# Patient Record
Sex: Male | Born: 1950
Health system: Southern US, Community
[De-identification: ages and names within clinical notes are randomized; demographics above are authoritative.]

## PROBLEM LIST (undated history)

## (undated) DIAGNOSIS — E785 Hyperlipidemia, unspecified: Secondary | ICD-10-CM

## (undated) HISTORY — PX: KNEE SURGERY: SHX244

---

## 2005-02-16 ENCOUNTER — Ambulatory Visit (HOSPITAL_COMMUNITY): Admission: RE | Admit: 2005-02-16 | Discharge: 2005-02-16 | Payer: Self-pay | Admitting: Internal Medicine

## 2006-07-06 ENCOUNTER — Ambulatory Visit (HOSPITAL_COMMUNITY): Admission: RE | Admit: 2006-07-06 | Discharge: 2006-07-06 | Payer: Self-pay | Admitting: Internal Medicine

## 2006-07-10 ENCOUNTER — Ambulatory Visit (HOSPITAL_COMMUNITY): Admission: RE | Admit: 2006-07-10 | Discharge: 2006-07-10 | Payer: Self-pay | Admitting: Internal Medicine

## 2006-07-17 ENCOUNTER — Ambulatory Visit (HOSPITAL_BASED_OUTPATIENT_CLINIC_OR_DEPARTMENT_OTHER): Admission: RE | Admit: 2006-07-17 | Discharge: 2006-07-17 | Payer: Self-pay | Admitting: Orthopaedic Surgery

## 2006-09-26 ENCOUNTER — Encounter (HOSPITAL_COMMUNITY): Admission: RE | Admit: 2006-09-26 | Discharge: 2006-10-26 | Payer: Self-pay | Admitting: Orthopaedic Surgery

## 2006-10-31 ENCOUNTER — Ambulatory Visit: Admission: RE | Admit: 2006-10-31 | Discharge: 2006-10-31 | Payer: Self-pay | Admitting: Orthopaedic Surgery

## 2006-10-31 ENCOUNTER — Ambulatory Visit: Payer: Self-pay | Admitting: Vascular Surgery

## 2009-01-27 ENCOUNTER — Ambulatory Visit (HOSPITAL_COMMUNITY): Admission: RE | Admit: 2009-01-27 | Discharge: 2009-01-27 | Payer: Self-pay | Admitting: Internal Medicine

## 2009-07-22 ENCOUNTER — Emergency Department (HOSPITAL_COMMUNITY): Admission: EM | Admit: 2009-07-22 | Discharge: 2009-07-22 | Payer: Self-pay | Admitting: Emergency Medicine

## 2010-09-13 NOTE — H&P (Signed)
NAME:  Brandon Neal, Brandon Neal               ACCOUNT NO.:  [id number]   MEDICAL RECORD NO.:  [id number]          PATIENT TYPE:  AMB   LOCATION:  DAY                           FACILITY:  [facility]   PHYSICIAN:  Dalia Heading, M.D.  DATE OF BIRTH:  09-Nov-1950   DATE OF ADMISSION:  DATE OF DISCHARGE:  LH                              HISTORY & PHYSICAL   CHIEF COMPLAINT:  Family history of colon carcinoma.   HISTORY OF PRESENT ILLNESS:  The patient is a 60 year old white male,  who is referred for endoscopic evaluation.  He needs colonoscopy due to  a family history of colon carcinoma in his father.  No abdominal pain,  weight loss, nausea, vomiting, diarrhea, constipation, melena,  hematochezia have been noted.  He last had  colonoscopy in 2003 which  was negative.   PAST MEDICAL HISTORY:  Hypertension.   PAST SURGICAL HISTORY:  Unremarkable.   CURRENT MEDICATIONS:  Diovan.   ALLERGIES:  No known drug allergies.   REVIEW OF SYSTEMS:  Noncontributory.   PHYSICAL EXAMINATION:  GENERAL:  The patient is a well-developed, well-  nourished white male in no acute distress.  LUNGS:  Clear to auscultation with equal breath sounds bilaterally.  HEART:  Regular rate and rhythm without S3, S4, or murmurs.  ABDOMEN:  Soft, nontender, nondistended.  No hepatosplenomegaly or  masses are noted.  RECTAL:  Deferred to the procedure.   IMPRESSION:  Family history of colon carcinoma.   PLAN:  The patient is scheduled for a colonoscopy on October 10, 2006.  The  risks and benefits of the procedure increasing bleeding and perforation  were fully explained to the patient, gave informed consent.      Dalia Heading, M.D.  Electronically Signed     MAJ/MEDQ  D:  10/02/2006  T:  10/02/2006  Job:  [id number]   cc:   [facility]  Fax: [phone]   Patrica Duel, M.D.  Fax: [phone]

## 2010-09-16 NOTE — Op Note (Signed)
NAME:  Brandon Neal, Brandon Neal               ACCOUNT NO.:  [id number]   MEDICAL RECORD NO.:  [id number]          PATIENT TYPE:  AMB   LOCATION:  DSC                          FACILITY:  [facility]   PHYSICIAN:  Claude Manges. Whitfield, M.D.DATE OF BIRTH:  Feb 14, 1951   DATE OF PROCEDURE:  07/17/2006  DATE OF DISCHARGE:                               OPERATIVE REPORT   PREOPERATIVE DIAGNOSIS:  Tear medial meniscus, right knee.   POSTOPERATIVE DIAGNOSIS:  Tear medial meniscus, right knee.   PROCEDURE:  1. Diagnostic arthroscopy right knee.  2. Arthroscopic medial meniscectomy.   SURGEON:  Claude Manges. Cleophas Dunker, M.D.   ANESTHESIA:  General laryngeal.   COMPLICATIONS:  None.   HISTORY:  60 year old gentleman has been experiencing some problems with  his right knee for approximately three weeks without history of injury  or trauma.  He has been followed by his family physician in [place]  who obtained an MRI scan revealing a complex tear of the posterior horn  of the medial meniscus extending into the posterior body of the  meniscus.  There was some mild partial tearing of the anterior and  posterior cruciate ligaments, grade III chondromalacia of the patella,  and moderate to large sized effusion.  There was also evidence of a mild  stress fracture of the medial tibial plateau with bone marrow edema  throughout the proximal tibia.  He is symptomatic medially and now we  will proceed with an arthroscopic evaluation.   DESCRIPTION OF PROCEDURE:  With the patient comfortable on the operating  table and under general laryngeal anesthesia, the right lower extremity  was placed in a thigh holder.  The leg was then prepped with DuraPrep  from the thigh holder to the ankle.  Sterile draping was performed.   Diagnostic arthroscopy was performed using a medial and lateral  parapatellar tendon stab wound and there was a small clear yellow joint  effusion.  Diagnostic arthroscopy revealed a tear of the  posterior third  of the medial meniscus which was complex in nature with both horizontal  cleavage tears and small flap tears. Using the basket forceps, the CUDA  shaver, and the ArthroCare wand, torn portions were removed and the  remaining rim was smoothed and stabilized with the ArthroCare wand.  Probing revealed no further tearing.  There was very minimal  chondromalacia, maybe grade 1-2 changes of chondromalacia of the medial  compartment. There was some mild synovitis which was debrided with the  articular wand in the medial compartment.  The ACL appeared to be  intact.  The lateral compartment was intact with no evidence of meniscal  tear or chondromalacia.   There was some grade II changes of chondromalacia of the patella and  this was debrided.  The joint was inspected without evidence of loose  material.  The two stab wounds were left open and infiltrated with 0.25%  Marcaine with epinephrine.  A sterile bulky dressing was applied  followed by an Ace bandage.   PLAN:  Percocet for pain.  Office in one week.      Claude Manges. Cleophas Dunker, M.D.  Electronically  Signed     PWW/MEDQ  D:  07/17/2006  T:  07/17/2006  Job:  [id number]

## 2011-08-22 ENCOUNTER — Encounter (INDEPENDENT_AMBULATORY_CARE_PROVIDER_SITE_OTHER): Payer: Self-pay | Admitting: *Deleted

## 2011-09-13 ENCOUNTER — Encounter (INDEPENDENT_AMBULATORY_CARE_PROVIDER_SITE_OTHER): Payer: Self-pay | Admitting: *Deleted

## 2011-09-13 ENCOUNTER — Telehealth (INDEPENDENT_AMBULATORY_CARE_PROVIDER_SITE_OTHER): Payer: Self-pay | Admitting: *Deleted

## 2011-09-13 ENCOUNTER — Other Ambulatory Visit (INDEPENDENT_AMBULATORY_CARE_PROVIDER_SITE_OTHER): Payer: Self-pay | Admitting: *Deleted

## 2011-09-13 DIAGNOSIS — Z1211 Encounter for screening for malignant neoplasm of colon: Secondary | ICD-10-CM

## 2011-09-13 NOTE — Telephone Encounter (Signed)
Patient needs movi prep 

## 2011-09-19 MED ORDER — PEG-KCL-NACL-NASULF-NA ASC-C 100 G PO SOLR: 1.0000 | Freq: Once | ORAL | Status: DC

## 2011-10-27 ENCOUNTER — Telehealth (INDEPENDENT_AMBULATORY_CARE_PROVIDER_SITE_OTHER): Payer: Self-pay | Admitting: *Deleted

## 2011-10-27 NOTE — Telephone Encounter (Signed)
PCP/Requesting MD: fagan  Name & DOB: Brandon Neal 07/12/1950     Procedure: tcs  Reason/Indication:  screening  Has patient had this procedure before?  no  If so, when, by whom and where?    Is there a family history of colon cancer?  no  Who?  What age when diagnosed?    Is patient diabetic?   no      Does patient have prosthetic heart valve?  no  Do you have a pacemaker?  no  Has patient had joint replacement within last 12 months?  no  Is patient on Coumadin, Plavix and/or Aspirin? yes  Medications: asa 81 mg daily, pravastatin 20 mg daily, one a day vit, calratin, otc acid med  Allergies: nkda  Medication Adjustment: asa 2 dasy  Procedure date & time: 11/23/11 at 830

## 2011-10-31 NOTE — Telephone Encounter (Signed)
agree

## 2011-11-14 ENCOUNTER — Encounter (HOSPITAL_COMMUNITY): Payer: Self-pay | Admitting: Pharmacy Technician

## 2011-11-22 MED ORDER — SODIUM CHLORIDE 0.45 % IV SOLN
Freq: Once | INTRAVENOUS | Status: AC
  Administered 2011-11-23: 08:00:00 via INTRAVENOUS

## 2011-11-23 ENCOUNTER — Ambulatory Visit (HOSPITAL_COMMUNITY)
Admission: RE | Admit: 2011-11-23 | Discharge: 2011-11-23 | Disposition: A | Discharge status: Home / Self Care | Payer: 59 | Source: Ambulatory Visit | Attending: Internal Medicine | Admitting: Internal Medicine

## 2011-11-23 ENCOUNTER — Encounter (HOSPITAL_COMMUNITY): Payer: Self-pay

## 2011-11-23 ENCOUNTER — Encounter (HOSPITAL_COMMUNITY)
Admission: RE | Disposition: A | Discharge status: Home / Self Care | Payer: Self-pay | Source: Ambulatory Visit | Attending: Internal Medicine

## 2011-11-23 DIAGNOSIS — K644 Residual hemorrhoidal skin tags: Secondary | ICD-10-CM | POA: Insufficient documentation

## 2011-11-23 DIAGNOSIS — Z1211 Encounter for screening for malignant neoplasm of colon: Secondary | ICD-10-CM | POA: Insufficient documentation

## 2011-11-23 DIAGNOSIS — K5730 Diverticulosis of large intestine without perforation or abscess without bleeding: Secondary | ICD-10-CM | POA: Insufficient documentation

## 2011-11-23 DIAGNOSIS — E785 Hyperlipidemia, unspecified: Secondary | ICD-10-CM | POA: Insufficient documentation

## 2011-11-23 HISTORY — PX: COLONOSCOPY: SHX5424

## 2011-11-23 HISTORY — DX: Hyperlipidemia, unspecified: E78.5

## 2011-11-23 SURGERY — COLONOSCOPY
Anesthesia: Moderate Sedation

## 2011-11-23 MED ORDER — MIDAZOLAM HCL 5 MG/5ML IJ SOLN
INTRAMUSCULAR | Status: DC | PRN
  Administered 2011-11-23 (×2): 2 mg via INTRAVENOUS

## 2011-11-23 MED ORDER — ATROPINE SULFATE 1 MG/ML IJ SOLN
INTRAMUSCULAR | Status: AC
  Filled 2011-11-23: qty 1

## 2011-11-23 MED ORDER — MIDAZOLAM HCL 5 MG/5ML IJ SOLN
INTRAMUSCULAR | Status: AC
  Filled 2011-11-23: qty 10

## 2011-11-23 MED ORDER — STERILE WATER FOR IRRIGATION IR SOLN
Status: DC | PRN
  Administered 2011-11-23: 09:00:00

## 2011-11-23 MED ORDER — ATROPINE SULFATE 1 MG/ML IJ SOLN
INTRAMUSCULAR | Status: DC | PRN
  Administered 2011-11-23 (×2): .5 mg via INTRAVENOUS

## 2011-11-23 MED ORDER — MEPERIDINE HCL 50 MG/ML IJ SOLN
INTRAMUSCULAR | Status: DC | PRN
  Administered 2011-11-23: 25 mg via INTRAVENOUS

## 2011-11-23 MED ORDER — MEPERIDINE HCL 50 MG/ML IJ SOLN
INTRAMUSCULAR | Status: AC
  Filled 2011-11-23: qty 1

## 2011-11-23 NOTE — OR Nursing (Signed)
844 Pt with HR in mid 30's.  Per verbal order Dr Karilyn Cota, administered 0.5 Atropine.  HR increased briefly into low 50's then dropped to high 30's. Per verbal order Dr Karilyn Cota, administered another 0.5 mg Atropine.  Pt repositioned on back.  HR increased to mid 60's.  Pt's skin warm, pink and dry

## 2011-11-23 NOTE — Op Note (Signed)
COLONOSCOPY PROCEDURE REPORT  PATIENT:  Brandon Neal  MR#:  [id number] Birthdate:  1951-04-12, 61 y.o., male Endoscopist:  Dr. Malissa Hippo, MD Referred By:  Dr. Carylon Perches, MD Procedure Date: 11/23/2011  Procedure:   Colonoscopy  Indications: Patient is 61 year old Caucasian male who is undergoing average risk screening colonoscopy to  Informed Consent:  The procedure and risks were reviewed with the patient and informed consent was obtained.  Medications:  Demerol 25 mg IV Versed 4 mg IV Atropine 1 mg IV for asymptomatic bradycardia.  Description of procedure:  After a digital rectal exam was performed, that colonoscope was advanced from the anus through the rectum and colon to the area of the cecum, ileocecal valve and appendiceal orifice. The cecum was deeply intubated. These structures were well-seen and photographed for the record. From the level of the cecum and ileocecal valve, the scope was slowly and cautiously withdrawn. The mucosal surfaces were carefully surveyed utilizing scope tip to flexion to facilitate fold flattening as needed. The scope was pulled down into the rectum where a thorough exam including retroflexion was performed.  Findings:   Prep satisfactory. Few small diverticula at sigmoid colon. No colonic polyps or other mucosal abnormalities. Normal rectal mucosa. Mall hemorrhoids below the dentate line.  Therapeutic/Diagnostic Maneuvers Performed:  None  Complications:  None  Cecal Withdrawal Time:  8 minutes  Impression:  Examination performed to cecum. Few small diverticula at sigmoid colon. Small external hemorrhoids. 1 mg IV atropine administered for asymptomatic sinus bradycardia.  Recommendations:  Standard instructions given. Will check heart rate post ambulation make sure he has physiologic response before he leaves day hospital. Next screening exam in 10 years.   Arius Harnois U  11/23/2011 9:00 AM  CC: Dr. Carylon Perches, MD & Dr. Bonnetta Barry ref.  provider found

## 2011-11-23 NOTE — H&P (Signed)
Brandon Neal is an 61 y.o. male.   Chief Complaint: Patient is here for colonoscopy HPI: Patient is 61 year old Caucasian male who is here for average risk screening colonoscopy. This is patient's first exam. He denies abdominal pain change in bowel habits or rectal bleeding. Family history is negative for colorectal carcinoma.  Past Medical History  Diagnosis Date  . Hyperlipidemia     Past Surgical History  Procedure Date  . Knee surgery     5 years ago    History reviewed. No pertinent family history. Social History:  reports that he has been smoking.  He does not have any smokeless tobacco history on file. He reports that he does not drink alcohol or use illicit drugs.  Allergies: No Known Allergies  Medications Prior to Admission  Medication Sig Dispense Refill  . aspirin EC 81 MG tablet Take 81 mg by mouth daily.      . Aspirin-Acetaminophen-Caffeine (GOODYS EXTRA STRENGTH PO) Take 1 Package by mouth as needed. For pain      . loratadine (CLARITIN) 10 MG tablet Take 10 mg by mouth daily.      . Multiple Vitamin (MULTIVITAMIN WITH MINERALS) TABS Take 1 tablet by mouth daily.      Marland Kitchen OVER THE COUNTER MEDICATION Take 3 tablets by mouth daily. Flexcin      . peg 3350 powder (MOVIPREP) 100 G SOLR Take 1 kit (100 g total) by mouth once.  1 kit  0  . pravastatin (PRAVACHOL) 20 MG tablet Take 20 mg by mouth daily.      . ranitidine (ACID REDUCER) 75 MG tablet Take 75 mg by mouth daily.        No results found for this or any previous visit (from the past 48 hour(s)). No results found.  ROS  Blood pressure 98/59, pulse 55, temperature 97.6 F (36.4 C), temperature source Oral, resp. rate 19, height 6\' 1"  (1.854 m), weight 240 lb (108.863 kg), SpO2 92.00%. Physical Exam  Constitutional: He appears well-developed and well-nourished.  HENT:  Mouth/Throat: Oropharynx is clear and moist.  Eyes: Conjunctivae are normal. No scleral icterus.  Neck: No thyromegaly present.    Cardiovascular: Normal rate, regular rhythm and normal heart sounds.   No murmur heard. Respiratory: Effort normal and breath sounds normal.  GI: Soft. He exhibits no mass. There is no tenderness.  Musculoskeletal: He exhibits no edema.  Lymphadenopathy:    He has no cervical adenopathy.  Neurological: He is alert.  Skin: Skin is warm and dry.     Assessment/Plan Average risk screening colonoscopy.  REHMAN,NAJEEB U 11/23/2011, 8:34 AM

## 2011-11-28 ENCOUNTER — Encounter (HOSPITAL_COMMUNITY): Payer: Self-pay | Admitting: Internal Medicine

## 2016-03-28 DIAGNOSIS — Z23 Encounter for immunization: Secondary | ICD-10-CM | POA: Diagnosis not present

## 2016-08-23 DIAGNOSIS — H52223 Regular astigmatism, bilateral: Secondary | ICD-10-CM | POA: Diagnosis not present

## 2016-08-23 DIAGNOSIS — H524 Presbyopia: Secondary | ICD-10-CM | POA: Diagnosis not present

## 2016-08-23 DIAGNOSIS — H5203 Hypermetropia, bilateral: Secondary | ICD-10-CM | POA: Diagnosis not present

## 2016-08-23 DIAGNOSIS — H25813 Combined forms of age-related cataract, bilateral: Secondary | ICD-10-CM | POA: Diagnosis not present

## 2016-11-03 DIAGNOSIS — Z125 Encounter for screening for malignant neoplasm of prostate: Secondary | ICD-10-CM | POA: Diagnosis not present

## 2016-11-03 DIAGNOSIS — Z79899 Other long term (current) drug therapy: Secondary | ICD-10-CM | POA: Diagnosis not present

## 2016-11-03 DIAGNOSIS — E785 Hyperlipidemia, unspecified: Secondary | ICD-10-CM | POA: Diagnosis not present

## 2016-11-03 DIAGNOSIS — R7301 Impaired fasting glucose: Secondary | ICD-10-CM | POA: Diagnosis not present

## 2016-11-10 DIAGNOSIS — Z23 Encounter for immunization: Secondary | ICD-10-CM | POA: Diagnosis not present

## 2016-11-10 DIAGNOSIS — Z6831 Body mass index (BMI) 31.0-31.9, adult: Secondary | ICD-10-CM | POA: Diagnosis not present

## 2016-11-10 DIAGNOSIS — R7303 Prediabetes: Secondary | ICD-10-CM | POA: Diagnosis not present

## 2016-11-10 DIAGNOSIS — Z0001 Encounter for general adult medical examination with abnormal findings: Secondary | ICD-10-CM | POA: Diagnosis not present

## 2016-11-10 DIAGNOSIS — I700 Atherosclerosis of aorta: Secondary | ICD-10-CM | POA: Diagnosis not present

## 2017-03-09 DIAGNOSIS — Z23 Encounter for immunization: Secondary | ICD-10-CM | POA: Diagnosis not present

## 2017-08-27 DIAGNOSIS — H25813 Combined forms of age-related cataract, bilateral: Secondary | ICD-10-CM | POA: Diagnosis not present

## 2017-11-09 DIAGNOSIS — R030 Elevated blood-pressure reading, without diagnosis of hypertension: Secondary | ICD-10-CM | POA: Diagnosis not present

## 2017-11-09 DIAGNOSIS — R7303 Prediabetes: Secondary | ICD-10-CM | POA: Diagnosis not present

## 2017-11-09 DIAGNOSIS — Z79899 Other long term (current) drug therapy: Secondary | ICD-10-CM | POA: Diagnosis not present

## 2017-11-09 DIAGNOSIS — Z125 Encounter for screening for malignant neoplasm of prostate: Secondary | ICD-10-CM | POA: Diagnosis not present

## 2017-11-09 DIAGNOSIS — E785 Hyperlipidemia, unspecified: Secondary | ICD-10-CM | POA: Diagnosis not present

## 2017-11-16 DIAGNOSIS — E785 Hyperlipidemia, unspecified: Secondary | ICD-10-CM | POA: Diagnosis not present

## 2017-11-16 DIAGNOSIS — Z23 Encounter for immunization: Secondary | ICD-10-CM | POA: Diagnosis not present

## 2017-11-16 DIAGNOSIS — E1169 Type 2 diabetes mellitus with other specified complication: Secondary | ICD-10-CM | POA: Diagnosis not present

## 2017-11-16 DIAGNOSIS — Z6830 Body mass index (BMI) 30.0-30.9, adult: Secondary | ICD-10-CM | POA: Diagnosis not present

## 2017-11-16 DIAGNOSIS — Z0001 Encounter for general adult medical examination with abnormal findings: Secondary | ICD-10-CM | POA: Diagnosis not present

## 2017-11-16 DIAGNOSIS — I700 Atherosclerosis of aorta: Secondary | ICD-10-CM | POA: Diagnosis not present

## 2017-12-03 ENCOUNTER — Other Ambulatory Visit: Payer: Self-pay | Admitting: Internal Medicine

## 2017-12-03 DIAGNOSIS — F17200 Nicotine dependence, unspecified, uncomplicated: Secondary | ICD-10-CM

## 2017-12-12 ENCOUNTER — Ambulatory Visit
Admission: RE | Admit: 2017-12-12 | Discharge: 2017-12-12 | Disposition: A | Discharge status: Home / Self Care | Payer: PPO | Source: Ambulatory Visit | Attending: Internal Medicine | Admitting: Internal Medicine

## 2017-12-12 DIAGNOSIS — F17200 Nicotine dependence, unspecified, uncomplicated: Secondary | ICD-10-CM

## 2017-12-12 DIAGNOSIS — F17210 Nicotine dependence, cigarettes, uncomplicated: Secondary | ICD-10-CM | POA: Diagnosis not present

## 2018-01-14 DIAGNOSIS — H2511 Age-related nuclear cataract, right eye: Secondary | ICD-10-CM | POA: Diagnosis not present

## 2018-01-14 DIAGNOSIS — H2513 Age-related nuclear cataract, bilateral: Secondary | ICD-10-CM | POA: Diagnosis not present

## 2018-02-12 NOTE — Patient Instructions (Signed)
Your procedure is scheduled on: 02/25/2018  Report to [facility] at  38   AM.  Call this number if you have problems the morning of surgery: [phone]   Do not eat food or drink liquids :After Midnight.      Take these medicines the morning of surgery with A SIP OF WATER: None   Do not wear jewelry, make-up or nail polish.  Do not wear lotions, powders, or perfumes. You may wear deodorant.  Do not shave 48 hours prior to surgery.  Do not bring valuables to the hospital.  Contacts, dentures or bridgework may not be worn into surgery.  Leave suitcase in the car. After surgery it may be brought to your room.  For patients admitted to the hospital, checkout time is 11:00 AM the day of discharge.   Patients discharged the day of surgery will not be allowed to drive home.  :     Please read over the following fact sheets that you were given: Coughing and Deep Breathing, Surgical Site Infection Prevention, Anesthesia Post-op Instructions and Care and Recovery After Surgery    Cataract A cataract is a clouding of the lens of the eye. When a lens becomes cloudy, vision is reduced based on the degree and nature of the clouding. Many cataracts reduce vision to some degree. Some cataracts make people more near-sighted as they develop. Other cataracts increase glare. Cataracts that are ignored and become worse can sometimes look white. The white color can be seen through the pupil. CAUSES   Aging. However, cataracts may occur at any age, even in newborns.   Certain drugs.   Trauma to the eye.   Certain diseases such as diabetes.   Specific eye diseases such as chronic inflammation inside the eye or a sudden attack of a rare form of glaucoma.   Inherited or acquired medical problems.  SYMPTOMS   Gradual, progressive drop in vision in the affected eye.   Severe, rapid visual loss. This most often happens when trauma is the cause.  DIAGNOSIS  To detect a cataract, an eye doctor examines  the lens. Cataracts are best diagnosed with an exam of the eyes with the pupils enlarged (dilated) by drops.  TREATMENT  For an early cataract, vision may improve by using different eyeglasses or stronger lighting. If that does not help your vision, surgery is the only effective treatment. A cataract needs to be surgically removed when vision loss interferes with your everyday activities, such as driving, reading, or watching TV. A cataract may also have to be removed if it prevents examination or treatment of another eye problem. Surgery removes the cloudy lens and usually replaces it with a substitute lens (intraocular lens, IOL).  At a time when both you and your doctor agree, the cataract will be surgically removed. If you have cataracts in both eyes, only one is usually removed at a time. This allows the operated eye to heal and be out of danger from any possible problems after surgery (such as infection or poor wound healing). In rare cases, a cataract may be doing damage to your eye. In these cases, your caregiver may advise surgical removal right away. The vast majority of people who have cataract surgery have better vision afterward. HOME CARE INSTRUCTIONS  If you are not planning surgery, you may be asked to do the following:  Use different eyeglasses.   Use stronger or brighter lighting.   Ask your eye doctor about reducing your medicine dose  or changing medicines if it is thought that a medicine caused your cataract. Changing medicines does not make the cataract go away on its own.   Become familiar with your surroundings. Poor vision can lead to injury. Avoid bumping into things on the affected side. You are at a higher risk for tripping or falling.   Exercise extreme care when driving or operating machinery.   Wear sunglasses if you are sensitive to bright light or experiencing problems with glare.  SEEK IMMEDIATE MEDICAL CARE IF:   You have a worsening or sudden vision loss.    You notice redness, swelling, or increasing pain in the eye.   You have a fever.  Document Released: 04/17/2005 Document Revised: 04/06/2011 Document Reviewed: 12/09/2010 [facility] Patient Information 2012 [place].PATIENT INSTRUCTIONS POST-ANESTHESIA  IMMEDIATELY FOLLOWING SURGERY:  Do not drive or operate machinery for the first twenty four hours after surgery.  Do not make any important decisions for twenty four hours after surgery or while taking narcotic pain medications or sedatives.  If you develop intractable nausea and vomiting or a severe headache please notify your doctor immediately.  FOLLOW-UP:  Please make an appointment with your surgeon as instructed. You do not need to follow up with anesthesia unless specifically instructed to do so.  WOUND CARE INSTRUCTIONS (if applicable):  Keep a dry clean dressing on the anesthesia/puncture wound site if there is drainage.  Once the wound has quit draining you may leave it open to air.  Generally you should leave the bandage intact for twenty four hours unless there is drainage.  If the epidural site drains for more than 36-48 hours please call the anesthesia department.  QUESTIONS?:  Please feel free to call your physician or the hospital operator if you have any questions, and they will be happy to assist you.

## 2018-02-18 ENCOUNTER — Other Ambulatory Visit: Payer: Self-pay

## 2018-02-18 ENCOUNTER — Encounter (HOSPITAL_COMMUNITY): Payer: Self-pay

## 2018-02-18 ENCOUNTER — Encounter (HOSPITAL_COMMUNITY)
Admission: RE | Admit: 2018-02-18 | Discharge: 2018-02-18 | Disposition: A | Discharge status: Home / Self Care | Payer: PPO | Source: Ambulatory Visit | Attending: Ophthalmology | Admitting: Ophthalmology

## 2018-02-18 DIAGNOSIS — Z01818 Encounter for other preprocedural examination: Secondary | ICD-10-CM | POA: Diagnosis not present

## 2018-02-18 LAB — BASIC METABOLIC PANEL
Anion gap: 8 (ref 5–15)
BUN: 18 mg/dL (ref 8–23)
CALCIUM: 9.2 mg/dL (ref 8.9–10.3)
CHLORIDE: 111 mmol/L (ref 98–111)
CO2: 21 mmol/L — ABNORMAL LOW (ref 22–32)
CREATININE: 1.28 mg/dL — AB (ref 0.61–1.24)
GFR calc Af Amer: >60 mL/min (ref >60)
GFR calc non Af Amer: 56 mL/min — ABNORMAL LOW (ref >60)
Glucose, Bld: 128 mg/dL — ABNORMAL HIGH (ref 70–99)
Potassium: 4.3 mmol/L (ref 3.5–5.1)
Sodium: 140 mmol/L (ref 135–145)

## 2018-02-18 LAB — CBC WITH DIFFERENTIAL/PLATELET
Abs Immature Granulocytes: 0.04 10*3/uL (ref 0.00–0.07)
BASOS PCT: 0 %
Basophils Absolute: 0 10*3/uL (ref 0.0–0.1)
EOS ABS: 0.3 10*3/uL (ref 0.0–0.5)
EOS PCT: 4 %
HCT: 44.1 % (ref 39.0–52.0)
HEMOGLOBIN: 14.5 g/dL (ref 13.0–17.0)
Immature Granulocytes: 1 %
Lymphocytes Relative: 25 %
Lymphs Abs: 2 10*3/uL (ref 0.7–4.0)
MCH: 30.5 pg (ref 26.0–34.0)
MCHC: 32.9 g/dL (ref 30.0–36.0)
MCV: 92.6 fL (ref 80.0–100.0)
MONO ABS: 0.5 10*3/uL (ref 0.1–1.0)
Monocytes Relative: 6 %
Neutro Abs: 5.1 10*3/uL (ref 1.7–7.7)
Neutrophils Relative %: 64 %
Platelets: 182 10*3/uL (ref 150–400)
RBC: 4.76 MIL/uL (ref 4.22–5.81)
RDW: 12.9 % (ref 11.5–15.5)
WBC: 7.9 10*3/uL (ref 4.0–10.5)
nRBC: 0 % (ref 0.0–0.2)

## 2018-02-22 MED ORDER — NEOMYCIN-POLYMYXIN-DEXAMETH 3.5-10000-0.1 OP SUSP
OPHTHALMIC | Status: AC
  Filled 2018-02-22: qty 5

## 2018-02-22 MED ORDER — CYCLOPENTOLATE-PHENYLEPHRINE 0.2-1 % OP SOLN
OPHTHALMIC | Status: AC
  Filled 2018-02-22: qty 2

## 2018-02-22 MED ORDER — LIDOCAINE HCL 3.5 % OP GEL
OPHTHALMIC | Status: AC
  Filled 2018-02-22: qty 1

## 2018-02-22 MED ORDER — PHENYLEPHRINE HCL 2.5 % OP SOLN
OPHTHALMIC | Status: AC
  Filled 2018-02-22: qty 15

## 2018-02-22 MED ORDER — TETRACAINE HCL 0.5 % OP SOLN
OPHTHALMIC | Status: AC
  Filled 2018-02-22: qty 4

## 2018-02-22 MED ORDER — LIDOCAINE HCL (PF) 1 % IJ SOLN
INTRAMUSCULAR | Status: AC
  Filled 2018-02-22: qty 2

## 2018-02-25 ENCOUNTER — Ambulatory Visit (HOSPITAL_COMMUNITY): Payer: PPO | Admitting: Anesthesiology

## 2018-02-25 ENCOUNTER — Encounter (HOSPITAL_COMMUNITY): Payer: Self-pay | Admitting: Ophthalmology

## 2018-02-25 ENCOUNTER — Ambulatory Visit (HOSPITAL_COMMUNITY)
Admission: RE | Admit: 2018-02-25 | Discharge: 2018-02-25 | Disposition: A | Discharge status: Home / Self Care | Payer: PPO | Source: Ambulatory Visit | Attending: Ophthalmology | Admitting: Ophthalmology

## 2018-02-25 ENCOUNTER — Encounter (HOSPITAL_COMMUNITY)
Admission: RE | Disposition: A | Discharge status: Home / Self Care | Payer: Self-pay | Source: Ambulatory Visit | Attending: Ophthalmology

## 2018-02-25 DIAGNOSIS — H2511 Age-related nuclear cataract, right eye: Secondary | ICD-10-CM | POA: Diagnosis not present

## 2018-02-25 DIAGNOSIS — F17200 Nicotine dependence, unspecified, uncomplicated: Secondary | ICD-10-CM | POA: Insufficient documentation

## 2018-02-25 DIAGNOSIS — Z7982 Long term (current) use of aspirin: Secondary | ICD-10-CM | POA: Insufficient documentation

## 2018-02-25 HISTORY — PX: CATARACT EXTRACTION W/PHACO: SHX586

## 2018-02-25 SURGERY — PHACOEMULSIFICATION, CATARACT, WITH IOL INSERTION
Anesthesia: Monitor Anesthesia Care | Site: Eye | Laterality: Right

## 2018-02-25 MED ORDER — BSS IO SOLN
INTRAOCULAR | Status: DC | PRN
  Administered 2018-02-25: 15 mL

## 2018-02-25 MED ORDER — MIDAZOLAM HCL 5 MG/5ML IJ SOLN
INTRAMUSCULAR | Status: DC | PRN
  Administered 2018-02-25: 2 mg via INTRAVENOUS

## 2018-02-25 MED ORDER — POVIDONE-IODINE 5 % OP SOLN
OPHTHALMIC | Status: DC | PRN
  Administered 2018-02-25: 1 via OPHTHALMIC

## 2018-02-25 MED ORDER — PROVISC 10 MG/ML IO SOLN
INTRAOCULAR | Status: DC | PRN
  Administered 2018-02-25: 0.85 mL via INTRAOCULAR

## 2018-02-25 MED ORDER — EPINEPHRINE PF 1 MG/ML IJ SOLN
INTRAMUSCULAR | Status: AC
  Filled 2018-02-25: qty 1

## 2018-02-25 MED ORDER — LIDOCAINE HCL 3.5 % OP GEL
1.0000 "application " | Freq: Once | OPHTHALMIC | Status: AC
  Administered 2018-02-25: 1 via OPHTHALMIC

## 2018-02-25 MED ORDER — LIDOCAINE HCL (PF) 1 % IJ SOLN
INTRAMUSCULAR | Status: DC | PRN
  Administered 2018-02-25: .5 mL

## 2018-02-25 MED ORDER — NEOMYCIN-POLYMYXIN-DEXAMETH 3.5-10000-0.1 OP SUSP
OPHTHALMIC | Status: DC | PRN
  Administered 2018-02-25: 1 [drp] via OPHTHALMIC

## 2018-02-25 MED ORDER — TETRACAINE HCL 0.5 % OP SOLN
1.0000 [drp] | OPHTHALMIC | Status: AC
  Administered 2018-02-25 (×3): 1 [drp] via OPHTHALMIC

## 2018-02-25 MED ORDER — MIDAZOLAM HCL 2 MG/2ML IJ SOLN
INTRAMUSCULAR | Status: AC
  Filled 2018-02-25: qty 2

## 2018-02-25 MED ORDER — LACTATED RINGERS IV SOLN
INTRAVENOUS | Status: DC
  Administered 2018-02-25: 07:00:00 via INTRAVENOUS

## 2018-02-25 MED ORDER — PHENYLEPHRINE HCL 2.5 % OP SOLN
1.0000 [drp] | OPHTHALMIC | Status: AC
  Administered 2018-02-25 (×3): 1 [drp] via OPHTHALMIC

## 2018-02-25 MED ORDER — CYCLOPENTOLATE-PHENYLEPHRINE 0.2-1 % OP SOLN
1.0000 [drp] | OPHTHALMIC | Status: AC
  Administered 2018-02-25 (×3): 1 [drp] via OPHTHALMIC

## 2018-02-25 MED ORDER — EPINEPHRINE PF 1 MG/ML IJ SOLN
INTRAOCULAR | Status: DC | PRN
  Administered 2018-02-25: 500 mL

## 2018-02-25 SURGICAL SUPPLY — 12 items

## 2018-02-25 NOTE — Anesthesia Postprocedure Evaluation (Addendum)
Anesthesia Post Note  Patient: Brandon Neal  Procedure(s) Performed: CATARACT EXTRACTION PHACO AND INTRAOCULAR LENS PLACEMENT RIGHT EYE CDE=9.47 (Right Eye)  Patient location during evaluation: Short Stay Anesthesia Type: General Level of consciousness: awake and alert and oriented Vital Signs Assessment: post-procedure vital signs reviewed and stable Respiratory status: spontaneous breathing Cardiovascular status: stable Postop Assessment: no apparent nausea or vomiting Anesthetic complications: no     Last Vitals:  Vitals:   02/25/18 0642  BP: 112/63  Pulse: (!) 58  Resp: 18  Temp: 36.7 C  SpO2: 93%    Last Pain:  Vitals:   02/25/18 0642  TempSrc: Oral  PainSc: 0-No pain                 DANIEL,KAREN

## 2018-02-25 NOTE — Anesthesia Preprocedure Evaluation (Addendum)
Anesthesia Evaluation  Patient identified by MRN, date of birth, ID band Patient awake    Reviewed: Allergy & Precautions, NPO status , Patient's Chart, lab work & pertinent test results  Airway Mallampati: II  TM Distance: >3 FB Neck ROM: Full    Dental no notable dental hx. (+) Teeth Intact   Pulmonary neg pulmonary ROS, Current Smoker,    Pulmonary exam normal breath sounds clear to auscultation       Cardiovascular Exercise Tolerance: Good negative cardio ROS Normal cardiovascular examI Rhythm:Regular Rate:Normal     Neuro/Psych negative neurological ROS  negative psych ROS   GI/Hepatic negative GI ROS, Neg liver ROS,   Endo/Other  negative endocrine ROS  Renal/GU negative Renal ROS  negative genitourinary   Musculoskeletal negative musculoskeletal ROS (+)   Abdominal   Peds negative pediatric ROS (+)  Hematology negative hematology ROS (+)   Anesthesia Other Findings   Reproductive/Obstetrics negative OB ROS                             Anesthesia Physical Anesthesia Plan  ASA: III  Anesthesia Plan: MAC   Post-op Pain Management:    Induction: Intravenous  PONV Risk Score and Plan:   Airway Management Planned: Nasal Cannula and Simple Face Mask  Additional Equipment:   Intra-op Plan:   Post-operative Plan: Extubation in OR  Informed Consent: I have reviewed the patients History and Physical, chart, labs and discussed the procedure including the risks, benefits and alternatives for the proposed anesthesia with the patient or authorized representative who has indicated his/her understanding and acceptance.   Dental advisory given  Plan Discussed with: CRNA  Anesthesia Plan Comments:        Anesthesia Quick Evaluation

## 2018-02-25 NOTE — H&P (Signed)
I have reviewed the H&P, the patient was re-examined, and I have identified no interval changes in medical condition and plan of care since the history and physical of record  

## 2018-02-25 NOTE — Discharge Instructions (Signed)

## 2018-02-25 NOTE — Addendum Note (Signed)
Addendum  created 02/25/18 0748 by Ollen Bowl, CRNA   Sign clinical note

## 2018-02-25 NOTE — Transfer of Care (Addendum)
Immediate Anesthesia Transfer of Care Note  Patient: Brandon Neal  Procedure(s) Performed: CATARACT EXTRACTION PHACO AND INTRAOCULAR LENS PLACEMENT RIGHT EYE CDE=9.47 (Right Eye)  Patient Location: Short Stay  Anesthesia Type:MAC  Level of Consciousness: awake, alert  and oriented  Airway & Oxygen Therapy: Patient Spontanous Breathing  Post-op Assessment: Report given to RN  Post vital signs: Reviewed  Last Vitals:  Vitals Value Taken Time  BP    Temp    Pulse    Resp    SpO2      Last Pain:  Vitals:   02/25/18 0642  TempSrc: Oral  PainSc: 0-No pain      Patients Stated Pain Goal: 5 (62/94/76 5465)  Complications: No apparent anesthesia complications

## 2018-02-25 NOTE — Addendum Note (Signed)
Addendum  created 02/25/18 1028 by Vista Deck, CRNA   Sign clinical note, SmartForm saved

## 2018-02-25 NOTE — Op Note (Signed)
Date of Admission: 02/25/2018  Date of Surgery: 02/25/2018  Pre-Op Dx: Cataract Right  Eye  Post-Op Dx: Senile Nuclear Cataract  Right  Eye,  Dx Code H25.11  Surgeon: Tonny Branch, M.D.  Assistants: None  Anesthesia: Topical with MAC  Indications: Painless, progressive loss of vision with compromise of daily activities.  Surgery: Cataract Extraction with Intraocular lens Implant Right Eye  Discription: The patient had dilating drops and viscous lidocaine placed into the Right eye in the pre-op holding area. After transfer to the operating room, a time out was performed. The patient was then prepped and draped. Beginning with a 62m blade a paracentesis port was made at the surgeon's 2 o'clock position. The anterior chamber was then filled with 1% non-preserved lidocaine. This was followed by filling the anterior chamber with Provisc.  A 2.422mkeratome blade was used to make a clear corneal incision at the temporal limbus.  A bent cystatome needle was used to create a continuous tear capsulotomy. Hydrodissection was performed with balanced salt solution on a Fine canula. The lens nucleus was then removed using the phacoemulsification handpiece. Residual cortex was removed with the I&A handpiece. The anterior chamber and capsular bag were refilled with Provisc. A posterior chamber intraocular lens was placed into the capsular bag with it's injector. The implant was positioned with the Kuglan hook. The Provisc was then removed from the anterior chamber and capsular bag with the I&A handpiece. Stromal hydration of the main incision and paracentesis port was performed with BSS on a Fine canula. The wounds were tested for leak which was negative. The patient tolerated the procedure well. There were no operative complications. The patient was then transferred to the recovery room in stable condition.  Complications: None  Specimen: None  EBL: None  Prosthetic device: [employer] Technis, PCB00, power 21.0,  SN [id number]

## 2018-02-26 ENCOUNTER — Encounter (HOSPITAL_COMMUNITY): Payer: Self-pay | Admitting: Ophthalmology

## 2018-04-02 ENCOUNTER — Other Ambulatory Visit (HOSPITAL_COMMUNITY): Payer: PPO

## 2018-04-12 MED ORDER — HYDROCODONE-ACETAMINOPHEN 7.5-325 MG PO TABS: 1.0000 | ORAL_TABLET | Freq: Once | ORAL | Status: DC | PRN

## 2018-04-12 MED ORDER — PROMETHAZINE HCL 25 MG/ML IJ SOLN: 6.2500 mg | INTRAMUSCULAR | Status: DC | PRN

## 2018-04-12 MED ORDER — MIDAZOLAM HCL 2 MG/2ML IJ SOLN: 0.5000 mg | Freq: Once | INTRAMUSCULAR | Status: DC | PRN

## 2018-04-12 MED ORDER — HYDROMORPHONE HCL 1 MG/ML IJ SOLN: 0.2500 mg | INTRAMUSCULAR | Status: DC | PRN

## 2018-04-15 ENCOUNTER — Encounter (HOSPITAL_COMMUNITY): Payer: Self-pay

## 2018-04-15 ENCOUNTER — Encounter (HOSPITAL_COMMUNITY)
Admission: RE | Admit: 2018-04-15 | Discharge: 2018-04-15 | Disposition: A | Discharge status: Home / Self Care | Payer: PPO | Source: Ambulatory Visit | Attending: Ophthalmology | Admitting: Ophthalmology

## 2018-04-22 ENCOUNTER — Encounter (HOSPITAL_COMMUNITY): Payer: Self-pay | Admitting: Ophthalmology

## 2018-04-22 ENCOUNTER — Encounter (HOSPITAL_COMMUNITY)
Admission: RE | Disposition: A | Discharge status: Home / Self Care | Payer: Self-pay | Source: Home / Self Care | Attending: Ophthalmology

## 2018-04-22 ENCOUNTER — Ambulatory Visit (HOSPITAL_COMMUNITY)
Admission: RE | Admit: 2018-04-22 | Discharge: 2018-04-22 | Disposition: A | Discharge status: Home / Self Care | Payer: PPO | Attending: Ophthalmology | Admitting: Ophthalmology

## 2018-04-22 ENCOUNTER — Ambulatory Visit (HOSPITAL_COMMUNITY): Payer: PPO | Admitting: Anesthesiology

## 2018-04-22 DIAGNOSIS — Z7982 Long term (current) use of aspirin: Secondary | ICD-10-CM | POA: Diagnosis not present

## 2018-04-22 DIAGNOSIS — H2512 Age-related nuclear cataract, left eye: Secondary | ICD-10-CM | POA: Diagnosis not present

## 2018-04-22 DIAGNOSIS — F17200 Nicotine dependence, unspecified, uncomplicated: Secondary | ICD-10-CM | POA: Insufficient documentation

## 2018-04-22 HISTORY — PX: CATARACT EXTRACTION W/PHACO: SHX586

## 2018-04-22 SURGERY — PHACOEMULSIFICATION, CATARACT, WITH IOL INSERTION
Anesthesia: Monitor Anesthesia Care | Site: Eye | Laterality: Left

## 2018-04-22 MED ORDER — MIDAZOLAM HCL 2 MG/2ML IJ SOLN
INTRAMUSCULAR | Status: AC
  Filled 2018-04-22: qty 2

## 2018-04-22 MED ORDER — PROMETHAZINE HCL 25 MG/ML IJ SOLN: 6.2500 mg | INTRAMUSCULAR | Status: DC | PRN

## 2018-04-22 MED ORDER — LACTATED RINGERS IV SOLN
INTRAVENOUS | Status: DC | PRN
  Administered 2018-04-22: 07:00:00 via INTRAVENOUS

## 2018-04-22 MED ORDER — PHENYLEPHRINE HCL 2.5 % OP SOLN
1.0000 [drp] | OPHTHALMIC | Status: AC
  Administered 2018-04-22 (×3): 1 [drp] via OPHTHALMIC

## 2018-04-22 MED ORDER — NEOMYCIN-POLYMYXIN-DEXAMETH 3.5-10000-0.1 OP SUSP
OPHTHALMIC | Status: DC | PRN
  Administered 2018-04-22: 2 [drp] via OPHTHALMIC

## 2018-04-22 MED ORDER — LACTATED RINGERS IV SOLN
INTRAVENOUS | Status: DC
  Administered 2018-04-22: 07:00:00 via INTRAVENOUS

## 2018-04-22 MED ORDER — TETRACAINE HCL 0.5 % OP SOLN
1.0000 [drp] | OPHTHALMIC | Status: AC
  Administered 2018-04-22 (×3): 1 [drp] via OPHTHALMIC

## 2018-04-22 MED ORDER — BSS IO SOLN
INTRAOCULAR | Status: DC | PRN
  Administered 2018-04-22: 15 mL via INTRAOCULAR

## 2018-04-22 MED ORDER — HYDROCODONE-ACETAMINOPHEN 7.5-325 MG PO TABS: 1.0000 | ORAL_TABLET | Freq: Once | ORAL | Status: DC | PRN

## 2018-04-22 MED ORDER — MIDAZOLAM HCL 5 MG/5ML IJ SOLN
INTRAMUSCULAR | Status: DC | PRN
  Administered 2018-04-22: 2 mg via INTRAVENOUS

## 2018-04-22 MED ORDER — LIDOCAINE HCL (PF) 1 % IJ SOLN
INTRAMUSCULAR | Status: DC | PRN
  Administered 2018-04-22: .7 mL

## 2018-04-22 MED ORDER — CYCLOPENTOLATE-PHENYLEPHRINE 0.2-1 % OP SOLN
1.0000 [drp] | OPHTHALMIC | Status: AC
  Administered 2018-04-22 (×3): 1 [drp] via OPHTHALMIC

## 2018-04-22 MED ORDER — EPINEPHRINE PF 1 MG/ML IJ SOLN
INTRAOCULAR | Status: DC | PRN
  Administered 2018-04-22: 500 mL

## 2018-04-22 MED ORDER — MIDAZOLAM HCL 2 MG/2ML IJ SOLN: 0.5000 mg | Freq: Once | INTRAMUSCULAR | Status: DC | PRN

## 2018-04-22 MED ORDER — HYDROMORPHONE HCL 1 MG/ML IJ SOLN: 0.2500 mg | INTRAMUSCULAR | Status: DC | PRN

## 2018-04-22 MED ORDER — LIDOCAINE HCL 3.5 % OP GEL
1.0000 "application " | Freq: Once | OPHTHALMIC | Status: AC
  Administered 2018-04-22: 1 via OPHTHALMIC

## 2018-04-22 MED ORDER — PROVISC 10 MG/ML IO SOLN
INTRAOCULAR | Status: DC | PRN
  Administered 2018-04-22: 0.85 mL via INTRAOCULAR

## 2018-04-22 MED ORDER — POVIDONE-IODINE 5 % OP SOLN
OPHTHALMIC | Status: DC | PRN
  Administered 2018-04-22: 1 via OPHTHALMIC

## 2018-04-22 SURGICAL SUPPLY — 12 items
CLOTH BEACON ORANGE TIMEOUT ST (SAFETY) ×2 IMPLANT
EYE SHIELD UNIVERSAL CLEAR (GAUZE/BANDAGES/DRESSINGS) ×2 IMPLANT
GLOVE BIOGEL PI IND STRL 7.0 (GLOVE) IMPLANT
GLOVE BIOGEL PI INDICATOR 7.0 (GLOVE) ×4
LENS ALC ACRYL/TECN (Ophthalmic Related) ×2 IMPLANT
NDL HYPO 18GX1.5 BLUNT FILL (NEEDLE) IMPLANT
NEEDLE HYPO 18GX1.5 BLUNT FILL (NEEDLE) ×3 IMPLANT
PAD ARMBOARD 7.5X6 YLW CONV (MISCELLANEOUS) ×2 IMPLANT
SYR TB 1ML LL NO SAFETY (SYRINGE) ×2 IMPLANT
TAPE SURG TRANSPORE 1 IN (GAUZE/BANDAGES/DRESSINGS) IMPLANT
TAPE SURGICAL TRANSPORE 1 IN (GAUZE/BANDAGES/DRESSINGS) ×2
WATER STERILE IRR 250ML POUR (IV SOLUTION) ×2 IMPLANT

## 2018-04-22 NOTE — Anesthesia Preprocedure Evaluation (Signed)
Anesthesia Evaluation  Patient identified by MRN, date of birth, ID band Patient awake    Reviewed: Allergy & Precautions, NPO status , Patient's Chart, lab work & pertinent test results  Airway Mallampati: II  TM Distance: >3 FB Neck ROM: Full    Dental no notable dental hx. (+) Teeth Intact   Pulmonary neg pulmonary ROS, Current Smoker,    Pulmonary exam normal breath sounds clear to auscultation       Cardiovascular Exercise Tolerance: Good negative cardio ROS Normal cardiovascular examI Rhythm:Regular Rate:Normal     Neuro/Psych negative neurological ROS  negative psych ROS   GI/Hepatic negative GI ROS, Neg liver ROS,   Endo/Other  negative endocrine ROS  Renal/GU negative Renal ROS  negative genitourinary   Musculoskeletal negative musculoskeletal ROS (+)   Abdominal   Peds negative pediatric ROS (+)  Hematology negative hematology ROS (+)   Anesthesia Other Findings   Reproductive/Obstetrics negative OB ROS                             Anesthesia Physical Anesthesia Plan  ASA: II  Anesthesia Plan: MAC   Post-op Pain Management:    Induction:   PONV Risk Score and Plan:   Airway Management Planned: Nasal Cannula and Simple Face Mask  Additional Equipment:   Intra-op Plan:   Post-operative Plan:   Informed Consent:   Plan Discussed with:   Anesthesia Plan Comments:         Anesthesia Quick Evaluation

## 2018-04-22 NOTE — Discharge Instructions (Signed)

## 2018-04-22 NOTE — Transfer of Care (Signed)
Immediate Anesthesia Transfer of Care Note  Patient: Brandon Neal  Procedure(s) Performed: CATARACT EXTRACTION PHACO AND INTRAOCULAR LENS PLACEMENT (IOC) (Left Eye)  Patient Location: PACU and Short Stay  Anesthesia Type:MAC  Level of Consciousness: awake  Airway & Oxygen Therapy: Patient Spontanous Breathing  Post-op Assessment: Report given to RN  Post vital signs: Reviewed  Last Vitals:  Vitals Value Taken Time  BP    Temp    Pulse    Resp    SpO2      Last Pain:  Vitals:   04/22/18 0648  TempSrc: Oral  PainSc: 0-No pain      Patients Stated Pain Goal: 5 (49/20/10 0712)  Complications: No apparent anesthesia complications

## 2018-04-22 NOTE — Op Note (Signed)
Date of Admission: 04/22/2018  Date of Surgery: 04/22/2018  Pre-Op Dx: Cataract Left  Eye  Post-Op Dx: Senile Nuclear Cataract  Left  Eye,  Dx Code H25.12  Surgeon: Tonny Branch, M.D.  Assistants: None  Anesthesia: Topical with MAC  Indications: Painless, progressive loss of vision with compromise of daily activities.  Surgery: Cataract Extraction with Intraocular lens Implant Left Eye  Discription: The patient had dilating drops and viscous lidocaine placed into the Left eye in the pre-op holding area. After transfer to the operating room, a time out was performed. The patient was then prepped and draped. Beginning with a 93m blade a paracentesis port was made at the surgeon's 2 o'clock position. The anterior chamber was then filled with 1% non-preserved lidocaine. This was followed by filling the anterior chamber with Provisc.  A 2.469mkeratome blade was used to make a clear corneal incision at the temporal limbus.  A bent cystatome needle was used to create a continuous tear capsulotomy. Hydrodissection was performed with balanced salt solution on a Fine canula. The lens nucleus was then removed using the phacoemulsification handpiece. Residual cortex was removed with the I&A handpiece. The anterior chamber and capsular bag were refilled with Provisc. A posterior chamber intraocular lens was placed into the capsular bag with it's injector. The implant was positioned with the Kuglan hook. The Provisc was then removed from the anterior chamber and capsular bag with the I&A handpiece. Stromal hydration of the main incision and paracentesis port was performed with BSS on a Fine canula. The wounds were tested for leak which was negative. The patient tolerated the procedure well. There were no operative complications. The patient was then transferred to the recovery room in stable condition.  Complications: None  Specimen: None  EBL: None  Prosthetic device: J&J Technis, PCB00, power 21.5, SN  [id number]

## 2018-04-22 NOTE — Anesthesia Postprocedure Evaluation (Signed)
Anesthesia Post Note  Patient: Brandon Neal  Procedure(s) Performed: CATARACT EXTRACTION PHACO AND INTRAOCULAR LENS PLACEMENT (IOC) (Left Eye)  Patient location during evaluation: Short Stay Anesthesia Type: MAC Level of consciousness: awake and alert and oriented Pain management: pain level controlled Vital Signs Assessment: post-procedure vital signs reviewed and stable Respiratory status: spontaneous breathing Cardiovascular status: blood pressure returned to baseline Postop Assessment: no apparent nausea or vomiting Anesthetic complications: no     Last Vitals:  Vitals:   04/22/18 0648  BP: 112/61  Pulse: 61  Resp: 18  Temp: 36.9 C  SpO2: 96%    Last Pain:  Vitals:   04/22/18 0648  TempSrc: Oral  PainSc: 0-No pain                 Rashaan Wyles

## 2018-04-22 NOTE — H&P (Signed)
I have reviewed the H&P, the patient was re-examined, and I have identified no interval changes in medical condition and plan of care since the history and physical of record  

## 2018-04-23 ENCOUNTER — Encounter (HOSPITAL_COMMUNITY): Payer: Self-pay | Admitting: Ophthalmology

## 2018-06-26 DIAGNOSIS — H43813 Vitreous degeneration, bilateral: Secondary | ICD-10-CM | POA: Diagnosis not present

## 2018-06-26 DIAGNOSIS — H43391 Other vitreous opacities, right eye: Secondary | ICD-10-CM | POA: Diagnosis not present

## 2018-06-26 DIAGNOSIS — H33321 Round hole, right eye: Secondary | ICD-10-CM | POA: Diagnosis not present

## 2018-12-06 DIAGNOSIS — E119 Type 2 diabetes mellitus without complications: Secondary | ICD-10-CM | POA: Diagnosis not present

## 2018-12-06 DIAGNOSIS — E785 Hyperlipidemia, unspecified: Secondary | ICD-10-CM | POA: Diagnosis not present

## 2018-12-06 DIAGNOSIS — I700 Atherosclerosis of aorta: Secondary | ICD-10-CM | POA: Diagnosis not present

## 2018-12-13 DIAGNOSIS — E1169 Type 2 diabetes mellitus with other specified complication: Secondary | ICD-10-CM | POA: Diagnosis not present

## 2018-12-13 DIAGNOSIS — I700 Atherosclerosis of aorta: Secondary | ICD-10-CM | POA: Diagnosis not present

## 2018-12-13 DIAGNOSIS — J439 Emphysema, unspecified: Secondary | ICD-10-CM | POA: Diagnosis not present

## 2018-12-13 DIAGNOSIS — E785 Hyperlipidemia, unspecified: Secondary | ICD-10-CM | POA: Diagnosis not present

## 2018-12-19 ENCOUNTER — Other Ambulatory Visit (HOSPITAL_COMMUNITY): Payer: Self-pay | Admitting: Internal Medicine

## 2018-12-19 ENCOUNTER — Other Ambulatory Visit: Payer: Self-pay | Admitting: Internal Medicine

## 2018-12-19 DIAGNOSIS — J439 Emphysema, unspecified: Secondary | ICD-10-CM

## 2018-12-24 DIAGNOSIS — B078 Other viral warts: Secondary | ICD-10-CM | POA: Diagnosis not present

## 2018-12-24 DIAGNOSIS — C44622 Squamous cell carcinoma of skin of right upper limb, including shoulder: Secondary | ICD-10-CM | POA: Diagnosis not present

## 2018-12-31 ENCOUNTER — Ambulatory Visit (HOSPITAL_COMMUNITY)
Admission: RE | Admit: 2018-12-31 | Discharge: 2018-12-31 | Disposition: A | Discharge status: Home / Self Care | Payer: PPO | Source: Ambulatory Visit | Attending: Internal Medicine | Admitting: Internal Medicine

## 2018-12-31 ENCOUNTER — Other Ambulatory Visit: Payer: Self-pay

## 2018-12-31 ENCOUNTER — Other Ambulatory Visit (HOSPITAL_COMMUNITY): Payer: Self-pay | Admitting: Internal Medicine

## 2018-12-31 DIAGNOSIS — R918 Other nonspecific abnormal finding of lung field: Secondary | ICD-10-CM | POA: Diagnosis not present

## 2018-12-31 DIAGNOSIS — J439 Emphysema, unspecified: Secondary | ICD-10-CM | POA: Diagnosis not present

## 2018-12-31 DIAGNOSIS — I700 Atherosclerosis of aorta: Secondary | ICD-10-CM | POA: Insufficient documentation

## 2018-12-31 DIAGNOSIS — Z87891 Personal history of nicotine dependence: Secondary | ICD-10-CM | POA: Diagnosis not present

## 2019-01-02 ENCOUNTER — Other Ambulatory Visit (HOSPITAL_COMMUNITY): Payer: Self-pay | Admitting: Internal Medicine

## 2019-01-02 DIAGNOSIS — R918 Other nonspecific abnormal finding of lung field: Secondary | ICD-10-CM

## 2019-01-13 ENCOUNTER — Other Ambulatory Visit: Payer: Self-pay

## 2019-01-13 ENCOUNTER — Ambulatory Visit (HOSPITAL_COMMUNITY)
Admission: RE | Admit: 2019-01-13 | Discharge: 2019-01-13 | Disposition: A | Discharge status: Home / Self Care | Payer: PPO | Source: Ambulatory Visit | Attending: Internal Medicine | Admitting: Internal Medicine

## 2019-01-13 DIAGNOSIS — R918 Other nonspecific abnormal finding of lung field: Secondary | ICD-10-CM | POA: Insufficient documentation

## 2019-01-13 DIAGNOSIS — R911 Solitary pulmonary nodule: Secondary | ICD-10-CM | POA: Diagnosis not present

## 2019-01-13 MED ORDER — FLUDEOXYGLUCOSE F - 18 (FDG) INJECTION
12.9000 | Freq: Once | INTRAVENOUS | Status: AC | PRN
  Administered 2019-01-13: 12.9 via INTRAVENOUS

## 2019-02-04 DIAGNOSIS — L570 Actinic keratosis: Secondary | ICD-10-CM | POA: Diagnosis not present

## 2019-02-04 DIAGNOSIS — Z85828 Personal history of other malignant neoplasm of skin: Secondary | ICD-10-CM | POA: Diagnosis not present

## 2019-02-04 DIAGNOSIS — X32XXXA Exposure to sunlight, initial encounter: Secondary | ICD-10-CM | POA: Diagnosis not present

## 2019-02-04 DIAGNOSIS — Z08 Encounter for follow-up examination after completed treatment for malignant neoplasm: Secondary | ICD-10-CM | POA: Diagnosis not present

## 2019-02-04 DIAGNOSIS — C44629 Squamous cell carcinoma of skin of left upper limb, including shoulder: Secondary | ICD-10-CM | POA: Diagnosis not present

## 2019-03-11 DIAGNOSIS — Z08 Encounter for follow-up examination after completed treatment for malignant neoplasm: Secondary | ICD-10-CM | POA: Diagnosis not present

## 2019-03-11 DIAGNOSIS — Z85828 Personal history of other malignant neoplasm of skin: Secondary | ICD-10-CM | POA: Diagnosis not present

## 2019-03-11 DIAGNOSIS — Z23 Encounter for immunization: Secondary | ICD-10-CM | POA: Diagnosis not present

## 2019-05-16 ENCOUNTER — Other Ambulatory Visit (HOSPITAL_COMMUNITY): Payer: Self-pay | Admitting: Internal Medicine

## 2019-05-16 DIAGNOSIS — R918 Other nonspecific abnormal finding of lung field: Secondary | ICD-10-CM

## 2019-05-30 ENCOUNTER — Other Ambulatory Visit (HOSPITAL_COMMUNITY): Payer: Self-pay | Admitting: Internal Medicine

## 2019-05-30 ENCOUNTER — Other Ambulatory Visit: Payer: Self-pay

## 2019-05-30 ENCOUNTER — Ambulatory Visit (HOSPITAL_COMMUNITY)
Admission: RE | Admit: 2019-05-30 | Discharge: 2019-05-30 | Disposition: A | Discharge status: Home / Self Care | Payer: PPO | Source: Ambulatory Visit | Attending: Internal Medicine | Admitting: Internal Medicine

## 2019-05-30 DIAGNOSIS — R918 Other nonspecific abnormal finding of lung field: Secondary | ICD-10-CM

## 2019-05-30 DIAGNOSIS — J439 Emphysema, unspecified: Secondary | ICD-10-CM | POA: Diagnosis not present

## 2019-12-12 ENCOUNTER — Inpatient Hospital Stay (HOSPITAL_COMMUNITY)
Admission: EM | Admit: 2019-12-12 | Discharge: 2019-12-14 | DRG: 494 | Disposition: A | Discharge status: Home / Self Care | Payer: PPO | Attending: Student | Admitting: Student

## 2019-12-12 ENCOUNTER — Emergency Department (HOSPITAL_COMMUNITY): Payer: PPO

## 2019-12-12 ENCOUNTER — Other Ambulatory Visit: Payer: Self-pay

## 2019-12-12 ENCOUNTER — Encounter (HOSPITAL_COMMUNITY): Payer: Self-pay

## 2019-12-12 DIAGNOSIS — S82252A Displaced comminuted fracture of shaft of left tibia, initial encounter for closed fracture: Secondary | ICD-10-CM | POA: Diagnosis not present

## 2019-12-12 DIAGNOSIS — Z20822 Contact with and (suspected) exposure to covid-19: Secondary | ICD-10-CM | POA: Diagnosis present

## 2019-12-12 DIAGNOSIS — S82392A Other fracture of lower end of left tibia, initial encounter for closed fracture: Secondary | ICD-10-CM | POA: Diagnosis not present

## 2019-12-12 DIAGNOSIS — M62838 Other muscle spasm: Secondary | ICD-10-CM | POA: Diagnosis not present

## 2019-12-12 DIAGNOSIS — S82402A Unspecified fracture of shaft of left fibula, initial encounter for closed fracture: Secondary | ICD-10-CM

## 2019-12-12 DIAGNOSIS — E785 Hyperlipidemia, unspecified: Secondary | ICD-10-CM | POA: Diagnosis not present

## 2019-12-12 DIAGNOSIS — Z7982 Long term (current) use of aspirin: Secondary | ICD-10-CM | POA: Diagnosis not present

## 2019-12-12 DIAGNOSIS — S82202A Unspecified fracture of shaft of left tibia, initial encounter for closed fracture: Secondary | ICD-10-CM

## 2019-12-12 DIAGNOSIS — F17210 Nicotine dependence, cigarettes, uncomplicated: Secondary | ICD-10-CM | POA: Diagnosis not present

## 2019-12-12 DIAGNOSIS — W208XXA Other cause of strike by thrown, projected or falling object, initial encounter: Secondary | ICD-10-CM | POA: Diagnosis present

## 2019-12-12 DIAGNOSIS — S82832A Other fracture of upper and lower end of left fibula, initial encounter for closed fracture: Secondary | ICD-10-CM | POA: Diagnosis not present

## 2019-12-12 DIAGNOSIS — S8252XA Displaced fracture of medial malleolus of left tibia, initial encounter for closed fracture: Secondary | ICD-10-CM | POA: Diagnosis not present

## 2019-12-12 DIAGNOSIS — M7732 Calcaneal spur, left foot: Secondary | ICD-10-CM | POA: Diagnosis not present

## 2019-12-12 DIAGNOSIS — S82432A Displaced oblique fracture of shaft of left fibula, initial encounter for closed fracture: Secondary | ICD-10-CM | POA: Diagnosis not present

## 2019-12-12 DIAGNOSIS — T148XXA Other injury of unspecified body region, initial encounter: Secondary | ICD-10-CM

## 2019-12-12 DIAGNOSIS — S82292A Other fracture of shaft of left tibia, initial encounter for closed fracture: Secondary | ICD-10-CM | POA: Diagnosis not present

## 2019-12-12 DIAGNOSIS — Z79899 Other long term (current) drug therapy: Secondary | ICD-10-CM | POA: Diagnosis not present

## 2019-12-12 DIAGNOSIS — S82102D Unspecified fracture of upper end of left tibia, subsequent encounter for closed fracture with routine healing: Secondary | ICD-10-CM | POA: Diagnosis not present

## 2019-12-12 DIAGNOSIS — R52 Pain, unspecified: Secondary | ICD-10-CM | POA: Diagnosis not present

## 2019-12-12 DIAGNOSIS — S82232A Displaced oblique fracture of shaft of left tibia, initial encounter for closed fracture: Secondary | ICD-10-CM | POA: Diagnosis not present

## 2019-12-12 LAB — BASIC METABOLIC PANEL
Anion gap: 9 (ref 5–15)
BUN: 18 mg/dL (ref 8–23)
CO2: 22 mmol/L (ref 22–32)
Calcium: 9.3 mg/dL (ref 8.9–10.3)
Chloride: 108 mmol/L (ref 98–111)
Creatinine, Ser: 1.28 mg/dL — ABNORMAL HIGH (ref 0.61–1.24)
GFR calc Af Amer: >60 mL/min (ref >60)
GFR calc non Af Amer: 57 mL/min — ABNORMAL LOW (ref >60)
Glucose, Bld: 119 mg/dL — ABNORMAL HIGH (ref 70–99)
Potassium: 4.3 mmol/L (ref 3.5–5.1)
Sodium: 139 mmol/L (ref 135–145)

## 2019-12-12 LAB — CBC
HCT: 44.7 % (ref 39.0–52.0)
Hemoglobin: 14.7 g/dL (ref 13.0–17.0)
MCH: 30.6 pg (ref 26.0–34.0)
MCHC: 32.9 g/dL (ref 30.0–36.0)
MCV: 93.1 fL (ref 80.0–100.0)
Platelets: 195 10*3/uL (ref 150–400)
RBC: 4.8 MIL/uL (ref 4.22–5.81)
RDW: 13.1 % (ref 11.5–15.5)
WBC: 12.9 10*3/uL — ABNORMAL HIGH (ref 4.0–10.5)
nRBC: 0 % (ref 0.0–0.2)

## 2019-12-12 LAB — SARS CORONAVIRUS 2 BY RT PCR (HOSPITAL ORDER, PERFORMED IN ~~LOC~~ HOSPITAL LAB): SARS Coronavirus 2: NEGATIVE

## 2019-12-12 MED ORDER — POVIDONE-IODINE 10 % EX SWAB: 2.0000 "application " | Freq: Once | CUTANEOUS | Status: DC

## 2019-12-12 MED ORDER — HYDROMORPHONE HCL 1 MG/ML IJ SOLN
0.5000 mg | INTRAMUSCULAR | Status: DC | PRN
  Administered 2019-12-12: 1 mg via INTRAVENOUS
  Filled 2019-12-12 (×2): qty 1

## 2019-12-12 MED ORDER — CEFAZOLIN SODIUM-DEXTROSE 2-4 GM/100ML-% IV SOLN
2.0000 g | INTRAVENOUS | Status: AC
  Administered 2019-12-13: 2 g via INTRAVENOUS
  Filled 2019-12-12: qty 100

## 2019-12-12 MED ORDER — HYDROCODONE-ACETAMINOPHEN 5-325 MG PO TABS: 1.0000 | ORAL_TABLET | Freq: Four times a day (QID) | ORAL | Status: DC | PRN

## 2019-12-12 MED ORDER — HYDROCODONE-ACETAMINOPHEN 5-325 MG PO TABS
1.0000 | ORAL_TABLET | Freq: Four times a day (QID) | ORAL | Status: DC | PRN
  Administered 2019-12-12: 2 via ORAL
  Filled 2019-12-12: qty 2

## 2019-12-12 MED ORDER — HYDROCODONE-ACETAMINOPHEN 7.5-325 MG PO TABS
1.0000 | ORAL_TABLET | Freq: Four times a day (QID) | ORAL | Status: DC | PRN
  Administered 2019-12-13: 1 via ORAL
  Filled 2019-12-12: qty 1

## 2019-12-12 MED ORDER — ASPIRIN EC 81 MG PO TBEC
81.0000 mg | DELAYED_RELEASE_TABLET | Freq: Every day | ORAL | Status: DC
  Administered 2019-12-12 – 2019-12-13 (×2): 81 mg via ORAL
  Filled 2019-12-12 (×2): qty 1

## 2019-12-12 MED ORDER — ONDANSETRON HCL 4 MG/2ML IJ SOLN: 4.0000 mg | Freq: Four times a day (QID) | INTRAMUSCULAR | Status: DC | PRN

## 2019-12-12 MED ORDER — ONDANSETRON HCL 4 MG PO TABS: 4.0000 mg | ORAL_TABLET | Freq: Four times a day (QID) | ORAL | Status: DC | PRN

## 2019-12-12 MED ORDER — ACETAMINOPHEN 325 MG PO TABS: 325.0000 mg | ORAL_TABLET | Freq: Four times a day (QID) | ORAL | Status: DC | PRN

## 2019-12-12 MED ORDER — METHOCARBAMOL 500 MG PO TABS: 500.0000 mg | ORAL_TABLET | Freq: Four times a day (QID) | ORAL | Status: DC | PRN

## 2019-12-12 MED ORDER — POTASSIUM CHLORIDE IN NACL 20-0.9 MEQ/L-% IV SOLN
INTRAVENOUS | Status: DC
  Filled 2019-12-12: qty 1000

## 2019-12-12 MED ORDER — CHLORHEXIDINE GLUCONATE 4 % EX LIQD: 60.0000 mL | Freq: Once | CUTANEOUS | Status: DC

## 2019-12-12 NOTE — ED Provider Notes (Signed)
[facility] EMERGENCY DEPARTMENT Provider Note   CSN: [id number] Arrival date & time: 12/12/19  1727     History Chief Complaint  Patient presents with   Leg Pain    Brandon Neal is a 69 y.o. male.  Pt presents by EMS s/p injury to left lower leg just pta today. Was cutting a tree when it kicked back into leg. Pain constant, dull, moderate, non radiating, worse w movement. Skin intact. Denies leg/foot numbness. No weakness. Has not tried to stand or walk, as noted deformity to leg. Denies other pain or injury. Felt fine all day including just prior to injury. No anticoag use.   The history is provided by the patient, the spouse and the EMS personnel.  Leg Pain Associated symptoms: no back pain, no fever and no neck pain        Past Medical History:  Diagnosis Date   Hyperlipidemia     There are no problems to display for this patient.   Past Surgical History:  Procedure Laterality Date   CATARACT EXTRACTION W/PHACO Right 02/25/2018   Procedure: CATARACT EXTRACTION PHACO AND INTRAOCULAR LENS PLACEMENT RIGHT EYE CDE=9.47;  Surgeon: Tonny Branch, MD;  Location: [facility] ORS;  Service: Ophthalmology;  Laterality: Right;  right   CATARACT EXTRACTION W/PHACO Left 04/22/2018   Procedure: CATARACT EXTRACTION PHACO AND INTRAOCULAR LENS PLACEMENT (IOC);  Surgeon: Tonny Branch, MD;  Location: [facility] ORS;  Service: Ophthalmology;  Laterality: Left;  CDE: 9.04   COLONOSCOPY  11/23/2011   Procedure: COLONOSCOPY;  Surgeon: Rogene Houston, MD;  Location: [facility] ENDO SUITE;  Service: Endoscopy;  Laterality: N/A;  830   KNEE SURGERY Right        History reviewed. No pertinent family history.  Social History   Tobacco Use   Smoking status: Current Every Day Smoker    Packs/day: 1.00    Years: 50.00    Pack years: 50.00   Smokeless tobacco: Never Used  Vaping Use   Vaping Use: Never used  Substance Use Topics   Alcohol use: No   Drug use: No    Home  Medications Prior to Admission medications   Medication Sig Start Date End Date Taking? Authorizing Provider  aspirin EC 81 MG tablet Take 81 mg by mouth at bedtime.     [provider]  Multiple Vitamin (MULTIVITAMIN WITH MINERALS) TABS Take 1 tablet by mouth daily.    [provider]    Allergies    Patient has no known allergies.  Review of Systems   Review of Systems  Constitutional: Negative for fever.  HENT: Negative for nosebleeds.   Eyes: Negative for redness.  Respiratory: Negative for shortness of breath.   Cardiovascular: Negative for chest pain.  Gastrointestinal: Negative for abdominal pain.  Genitourinary: Negative for flank pain.  Musculoskeletal: Negative for back pain and neck pain.  Skin: Negative for wound.  Neurological: Negative for numbness and headaches.  Hematological: Does not bruise/bleed easily.  Psychiatric/Behavioral: Negative for confusion.    Physical Exam Updated Vital Signs BP 117/73 (BP Location: Right Arm)    Pulse 79    Temp 99.1 F (37.3 C) (Oral)    Resp 18    Ht 1.867 m (6' 1.5")    Wt 106 kg    SpO2 94%    BMI 30.41 kg/m   Physical Exam Vitals and nursing note reviewed.  Constitutional:      Appearance: Normal appearance. He is well-developed.  HENT:  Head: Atraumatic.     Nose: Nose normal.     Mouth/Throat:     Mouth: Mucous membranes are moist.     Pharynx: Oropharynx is clear.  Eyes:     General: No scleral icterus.    Conjunctiva/sclera: Conjunctivae normal.  Neck:     Trachea: No tracheal deviation.  Cardiovascular:     Rate and Rhythm: Normal rate.     Pulses: Normal pulses.  Pulmonary:     Effort: Pulmonary effort is normal. No accessory muscle usage or respiratory distress.  Abdominal:     General: There is no distension.     Palpations: Abdomen is soft.     Tenderness: There is no abdominal tenderness.  Genitourinary:    Comments: No cva tenderness. Musculoskeletal:        General: No  swelling.     Cervical back: Normal range of motion and neck supple. No rigidity.     Comments: Deformity to left lower leg. Skin is intact. Distal pulses palp. Compartments of leg are soft, not tense. Tenderness to mid/lower left tib/fib. No other focal bony tenderness noted.   Skin:    General: Skin is warm and dry.     Findings: No rash.  Neurological:     Mental Status: He is alert.     Comments: Alert, speech clear. Let foot, motor/sens function is intact.   Psychiatric:        Mood and Affect: Mood normal.     ED Results / Procedures / Treatments   Labs (all labs ordered are listed, but only abnormal results are displayed) Results for orders placed or performed during the hospital encounter of 12/12/19  CBC  Result Value Ref Range   WBC 12.9 (H) 4.0 - 10.5 K/uL   RBC 4.80 4.22 - 5.81 MIL/uL   Hemoglobin 14.7 13.0 - 17.0 g/dL   HCT 44.7 39 - 52 %   MCV 93.1 80.0 - 100.0 fL   MCH 30.6 26.0 - 34.0 pg   MCHC 32.9 30.0 - 36.0 g/dL   RDW 13.1 11.5 - 15.5 %   Platelets 195 150 - 400 K/uL   nRBC 0.0 0.0 - 0.2 %  Basic metabolic panel  Result Value Ref Range   Sodium 139 135 - 145 mmol/L   Potassium 4.3 3.5 - 5.1 mmol/L   Chloride 108 98 - 111 mmol/L   CO2 22 22 - 32 mmol/L   Glucose, Bld 119 (H) 70 - 99 mg/dL   BUN 18 8 - 23 mg/dL   Creatinine, Ser 1.28 (H) 0.61 - 1.24 mg/dL   Calcium 9.3 8.9 - 10.3 mg/dL   GFR calc non Af Amer 57 (L) >60 mL/min   GFR calc Af Amer >60 >60 mL/min   Anion gap 9 5 - 15   DG Tibia/Fibula Left  Result Date: 12/12/2019 CLINICAL DATA:  Deformity in the lower leg after tree fell on the patient's leg. EXAM: LEFT TIBIA AND FIBULA - 2 VIEW COMPARISON:  Ankle radiographs from 12/12/2019 FINDINGS: Displaced and overlapped oblique fracture of the distal tibia diaphysis noted. Transverse fracture at the junction of the mid and distal thirds of the fibula with 1 bone width of posterior displacement of the distal fracture fragment and with 1.3 cm of  overlap. Mildly comminuted oblique fracture in the proximal fibular diaphysis is likewise noted. Spurring at the quadriceps attachment to the patella. No knee effusion. IMPRESSION: Displaced fractures of the tibia fibula as detailed above. Electronically Signed  By: Van Clines M.D.   On: 12/12/2019 18:42   DG Ankle Complete Left  Result Date: 12/12/2019 CLINICAL DATA:  A tree fell on the patient's leg with resulting like pain and deformity. The. EXAM: LEFT ANKLE COMPLETE - 3+ VIEW COMPARISON:  None. FINDINGS: Displaced oblique fracture of the distal tibial shaft with 2.0 cm of overlap, 2.1 cm distal fragment lateral displacement, and 2.0 cm of distal fragment posterior displacement. A fracture at the junction of the mid and distal fibula is present, with 1.1 cm of overlap and 1.6 cm of posterior displacement of the distal fragment. Os peroneus noted. Plantar and Achilles calcaneal spurs. Plafond and talar dome appear intact. IMPRESSION: 1. Substantially displaced and overlapped fractures of the mid to distal tibial and fibular shafts. 2. Plantar calcaneal spurs. Electronically Signed   By: Van Clines M.D.   On: 12/12/2019 18:36    EKG None  Radiology DG Tibia/Fibula Left  Result Date: 12/12/2019 CLINICAL DATA:  Deformity in the lower leg after tree fell on the patient's leg. EXAM: LEFT TIBIA AND FIBULA - 2 VIEW COMPARISON:  Ankle radiographs from 12/12/2019 FINDINGS: Displaced and overlapped oblique fracture of the distal tibia diaphysis noted. Transverse fracture at the junction of the mid and distal thirds of the fibula with 1 bone width of posterior displacement of the distal fracture fragment and with 1.3 cm of overlap. Mildly comminuted oblique fracture in the proximal fibular diaphysis is likewise noted. Spurring at the quadriceps attachment to the patella. No knee effusion. IMPRESSION: Displaced fractures of the tibia fibula as detailed above. Electronically Signed   By: Van Clines M.D.   On: 12/12/2019 18:42   DG Ankle Complete Left  Result Date: 12/12/2019 CLINICAL DATA:  A tree fell on the patient's leg with resulting like pain and deformity. The. EXAM: LEFT ANKLE COMPLETE - 3+ VIEW COMPARISON:  None. FINDINGS: Displaced oblique fracture of the distal tibial shaft with 2.0 cm of overlap, 2.1 cm distal fragment lateral displacement, and 2.0 cm of distal fragment posterior displacement. A fracture at the junction of the mid and distal fibula is present, with 1.1 cm of overlap and 1.6 cm of posterior displacement of the distal fragment. Os peroneus noted. Plantar and Achilles calcaneal spurs. Plafond and talar dome appear intact. IMPRESSION: 1. Substantially displaced and overlapped fractures of the mid to distal tibial and fibular shafts. 2. Plantar calcaneal spurs. Electronically Signed   By: Van Clines M.D.   On: 12/12/2019 18:36    Procedures Procedures (including critical care time)  Medications Ordered in ED Medications - No data to display  ED Course  I have reviewed the triage vital signs and the nursing notes.  Pertinent labs & imaging results that were available during my care of the patient were reviewed by me and considered in my medical decision making (see chart for details).    MDM Rules/Calculators/A&P                         Stat xrays.  Iv ns. Pt currently declines any pain medication.   Splint to left lower leg remains intact. Distal pulses palp.   Orthopedics on call consulted.   Xrays reviewed/interpreted by me - displaced left tib/fib fxs.   Labs reviewed/interpreted by me - lytes normal. hgb normal.  Discussed pt with Dr Doreatha Martin - will admit.     Final Clinical Impression(s) / ED Diagnoses Final diagnoses:  None    Rx /  DC Orders ED Discharge Orders    None       Lajean Saver, MD 12/12/19 2043

## 2019-12-12 NOTE — H&P (Signed)
Orthopaedic Trauma Service (OTS) Consult   Patient ID: Brandon Neal MRN: [id number] DOB/AGE: [birth year] 69 y.o.  Reason for Consult: Left tibia/fibula fracture Referring Physician: Dr. Ashok Cordia, MD [facility] ED)  HPI: Brandon Neal is an 69 y.o. male being seen in consultation at the request of Dr. Ashok Cordia for left tibia/fibula fracture. Patient was cutting down a tree when he was struck in the left leg by the tree. Was brought to [facility] ED via EMS with obvious deformity to left leg. Was found to have left displaced, oblique tibia fracture. Orthopaedic trauma service consulted for evaluation and management.  Patient seen in emergency department.  Wife at bedside.  Patient with moderate pain in leg, frequent muscle spasms. Denies pain in any of his other extremities. Denies previous surgery to left leg. Denies any significant numbness/tingling. No PMH. Takes Aspirin 81 mg daily but otherwise on no anticoagulation. [place], works 7 days a week.  Past Medical History:  Diagnosis Date  . Hyperlipidemia     Past Surgical History:  Procedure Laterality Date  . CATARACT EXTRACTION W/PHACO Right 02/25/2018   Procedure: CATARACT EXTRACTION PHACO AND INTRAOCULAR LENS PLACEMENT RIGHT EYE CDE=9.47;  Surgeon: Tonny Branch, MD;  Location: [facility] ORS;  Service: Ophthalmology;  Laterality: Right;  right  . CATARACT EXTRACTION W/PHACO Left 04/22/2018   Procedure: CATARACT EXTRACTION PHACO AND INTRAOCULAR LENS PLACEMENT (IOC);  Surgeon: Tonny Branch, MD;  Location: [facility] ORS;  Service: Ophthalmology;  Laterality: Left;  CDE: 9.04  . COLONOSCOPY  11/23/2011   Procedure: COLONOSCOPY;  Surgeon: Rogene Houston, MD;  Location: [facility] ENDO SUITE;  Service: Endoscopy;  Laterality: N/A;  830  . KNEE SURGERY Right     History reviewed. No pertinent family history.  Social History:  reports that he has been smoking. He has a 50.00 pack-year smoking history. He has never used smokeless tobacco. He reports that he  does not drink alcohol and does not use drugs.  Allergies: No Known Allergies  Medications: Prior to Admission: (Not in a hospital admission)   ROS: Constitutional: No fever or chills Vision: No changes in vision ENT: No difficulty swallowing CV: No chest pain Pulm: No SOB or wheezing GI: No nausea or vomiting GU: No urgency or inability to hold urine Skin: No poor wound healing Neurologic: No numbness or tingling Psychiatric: No depression or anxiety Heme: No bruising Allergic: No reaction to medications or food   Exam: Blood pressure 130/76, pulse 62, temperature 99.1 F (37.3 C), temperature source Oral, resp. rate 16, height 6' 1.5" (1.867 m), weight 106 kg, SpO2 98 %. General: NAD Orientation: Alert and oriented x3 Mood and Affect: Mood and affect appropriate Gait: Not assessed due to known fracture Coordination and balance: Within normal limits  LLE: Short leg splint in place.  Nontender above splint.  Able to wiggle toes.  No significant pain with passive stretch of the toes.  Foot warm and well-perfused.  RLE: Skin without lesions. No tenderness to palpation. Full painless ROM, full strength in each muscle groups without evidence of instability.   Medical Decision Making: Data: Imaging: [facility] and lateral view of left tibia shows displaced oblique tibial shaft fracture with associated fibula fracture  Labs:  Results for orders placed or performed during the hospital encounter of 12/12/19 (from the past 24 hour(s))  CBC     Status: Abnormal   Collection Time: 12/12/19  7:01 PM  Result Value Ref Range   WBC 12.9 (H) 4.0 - 10.5  K/uL   RBC 4.80 4.22 - 5.81 MIL/uL   Hemoglobin 14.7 13.0 - 17.0 g/dL   HCT 44.7 39 - 52 %   MCV 93.1 80.0 - 100.0 fL   MCH 30.6 26.0 - 34.0 pg   MCHC 32.9 30.0 - 36.0 g/dL   RDW 13.1 11.5 - 15.5 %   Platelets 195 150 - 400 K/uL   nRBC 0.0 0.0 - 0.2 %  Basic metabolic panel     Status: Abnormal   Collection Time: 12/12/19  7:01 PM   Result Value Ref Range   Sodium 139 135 - 145 mmol/L   Potassium 4.3 3.5 - 5.1 mmol/L   Chloride 108 98 - 111 mmol/L   CO2 22 22 - 32 mmol/L   Glucose, Bld 119 (H) 70 - 99 mg/dL   BUN 18 8 - 23 mg/dL   Creatinine, Ser 1.28 (H) 0.61 - 1.24 mg/dL   Calcium 9.3 8.9 - 10.3 mg/dL   GFR calc non Af Amer 57 (L) >60 mL/min   GFR calc Af Amer >60 >60 mL/min   Anion gap 9 5 - 15     Assessment/Plan: 69 year old male, L leg struck by tree. Resulted in left tibia/fibula fracture  Patient has sustained significant injury to left lower extremity that will require surgical fixation. Would recommend proceeding with IMN left tibia tomorrow morning. Will admit to orthopaedic trauma service this evening. NPO effective midnight. Risks and benefits of procedure for tomorrow have been discussed with the patient and his wife. Risks discussed included bleeding, infection, malunion, nonunion, damage to surrounding nerves and blood vessels, pain, hardware prominence or irritation, hardware failure, stiffness,  DVT/PE, compartment syndrome, and even anesthesia complications. Patient agrees to proceed with surgery. Consent will be obtained   Allante Beane A. Carmie [facility] [phone] (office) [email or web address]

## 2019-12-12 NOTE — ED Triage Notes (Signed)
Pt BIB [facility] for eval of L leg injury w/ obvious deformity after it was struck by a tree he was cutting. EMS placed in splint, +CSM to foot both prior to and after splinting. Pt declined pain medication en route. Placed in recliner in triage w/ leg elevated

## 2019-12-12 NOTE — Progress Notes (Signed)
Orthopedic Tech Progress Note Patient Details:  Brandon Neal 1951-01-17 [id number]  Ortho Devices Type of Ortho Device: Post (short leg) splint Ortho Device/Splint Location: LLE Ortho Device/Splint Interventions: Application   Post Interventions Patient Tolerated: Well Instructions Provided: Care of device   [name] [name] 12/12/2019, 9:19 PM

## 2019-12-13 ENCOUNTER — Encounter (HOSPITAL_COMMUNITY)
Admission: EM | Disposition: A | Discharge status: Home / Self Care | Payer: Self-pay | Source: Home / Self Care | Attending: Student

## 2019-12-13 ENCOUNTER — Inpatient Hospital Stay (HOSPITAL_COMMUNITY): Payer: PPO

## 2019-12-13 ENCOUNTER — Inpatient Hospital Stay (HOSPITAL_COMMUNITY): Payer: PPO | Admitting: Certified Registered Nurse Anesthetist

## 2019-12-13 HISTORY — PX: TIBIA IM NAIL INSERTION: SHX2516

## 2019-12-13 LAB — COMPREHENSIVE METABOLIC PANEL
ALT: 21 U/L (ref 0–44)
AST: 19 U/L (ref 15–41)
Albumin: 3.8 g/dL (ref 3.5–5.0)
Alkaline Phosphatase: 60 U/L (ref 38–126)
Anion gap: 8 (ref 5–15)
BUN: 19 mg/dL (ref 8–23)
CO2: 24 mmol/L (ref 22–32)
Calcium: 9 mg/dL (ref 8.9–10.3)
Chloride: 107 mmol/L (ref 98–111)
Creatinine, Ser: 1.24 mg/dL (ref 0.61–1.24)
GFR calc Af Amer: >60 mL/min (ref >60)
GFR calc non Af Amer: 59 mL/min — ABNORMAL LOW (ref >60)
Glucose, Bld: 130 mg/dL — ABNORMAL HIGH (ref 70–99)
Potassium: 4.4 mmol/L (ref 3.5–5.1)
Sodium: 139 mmol/L (ref 135–145)
Total Bilirubin: 1 mg/dL (ref 0.3–1.2)
Total Protein: 6.3 g/dL — ABNORMAL LOW (ref 6.5–8.1)

## 2019-12-13 LAB — CBC
HCT: 41.3 % (ref 39.0–52.0)
Hemoglobin: 13.6 g/dL (ref 13.0–17.0)
MCH: 30.6 pg (ref 26.0–34.0)
MCHC: 32.9 g/dL (ref 30.0–36.0)
MCV: 92.8 fL (ref 80.0–100.0)
Platelets: 171 10*3/uL (ref 150–400)
RBC: 4.45 MIL/uL (ref 4.22–5.81)
RDW: 13.1 % (ref 11.5–15.5)
WBC: 12.2 10*3/uL — ABNORMAL HIGH (ref 4.0–10.5)
nRBC: 0 % (ref 0.0–0.2)

## 2019-12-13 LAB — MRSA PCR SCREENING: MRSA by PCR: NEGATIVE

## 2019-12-13 SURGERY — INSERTION, INTRAMEDULLARY ROD, TIBIA
Anesthesia: General | Laterality: Left

## 2019-12-13 MED ORDER — BACITRACIN ZINC 500 UNIT/GM EX OINT
TOPICAL_OINTMENT | CUTANEOUS | Status: AC
  Filled 2019-12-13: qty 28.35

## 2019-12-13 MED ORDER — VANCOMYCIN HCL 1000 MG IV SOLR
INTRAVENOUS | Status: DC | PRN
  Administered 2019-12-13: 1000 mg via TOPICAL

## 2019-12-13 MED ORDER — CHLORHEXIDINE GLUCONATE 0.12 % MT SOLN
OROMUCOSAL | Status: AC
  Administered 2019-12-13: 15 mL via OROMUCOSAL
  Filled 2019-12-13: qty 15

## 2019-12-13 MED ORDER — CHLORHEXIDINE GLUCONATE 0.12 % MT SOLN: 15.0000 mL | Freq: Once | OROMUCOSAL | Status: AC

## 2019-12-13 MED ORDER — LIDOCAINE 2% (20 MG/ML) 5 ML SYRINGE
INTRAMUSCULAR | Status: DC | PRN
  Administered 2019-12-13: 60 mg via INTRAVENOUS

## 2019-12-13 MED ORDER — ENOXAPARIN SODIUM 40 MG/0.4ML ~~LOC~~ SOLN
40.0000 mg | SUBCUTANEOUS | Status: DC
  Administered 2019-12-14: 40 mg via SUBCUTANEOUS
  Filled 2019-12-13: qty 0.4

## 2019-12-13 MED ORDER — ACETAMINOPHEN 160 MG/5ML PO SOLN: 1000.0000 mg | Freq: Once | ORAL | Status: DC | PRN

## 2019-12-13 MED ORDER — ACETAMINOPHEN 10 MG/ML IV SOLN
INTRAVENOUS | Status: DC | PRN
  Administered 2019-12-13: 1000 mg via INTRAVENOUS

## 2019-12-13 MED ORDER — POTASSIUM CHLORIDE IN NACL 20-0.9 MEQ/L-% IV SOLN
INTRAVENOUS | Status: DC
  Filled 2019-12-13: qty 1000

## 2019-12-13 MED ORDER — ASPIRIN EC 325 MG PO TBEC
325.0000 mg | DELAYED_RELEASE_TABLET | Freq: Two times a day (BID) | ORAL | 0 refills | Status: AC
Start: 2019-12-13 — End: 2020-01-12

## 2019-12-13 MED ORDER — FENTANYL CITRATE (PF) 250 MCG/5ML IJ SOLN
INTRAMUSCULAR | Status: AC
  Filled 2019-12-13: qty 5

## 2019-12-13 MED ORDER — DIPHENHYDRAMINE HCL 12.5 MG/5ML PO ELIX: 12.5000 mg | ORAL_SOLUTION | ORAL | Status: DC | PRN

## 2019-12-13 MED ORDER — DOCUSATE SODIUM 100 MG PO CAPS
100.0000 mg | ORAL_CAPSULE | Freq: Two times a day (BID) | ORAL | Status: DC
  Administered 2019-12-13 – 2019-12-14 (×3): 100 mg via ORAL
  Filled 2019-12-13 (×3): qty 1

## 2019-12-13 MED ORDER — ACETAMINOPHEN 500 MG PO TABS: 1000.0000 mg | ORAL_TABLET | Freq: Once | ORAL | Status: DC | PRN

## 2019-12-13 MED ORDER — ONDANSETRON HCL 4 MG/2ML IJ SOLN: 4.0000 mg | Freq: Four times a day (QID) | INTRAMUSCULAR | Status: DC | PRN

## 2019-12-13 MED ORDER — VANCOMYCIN HCL 1000 MG IV SOLR
INTRAVENOUS | Status: AC
  Filled 2019-12-13: qty 1000

## 2019-12-13 MED ORDER — FENTANYL CITRATE (PF) 250 MCG/5ML IJ SOLN
INTRAMUSCULAR | Status: DC | PRN
  Administered 2019-12-13: 50 ug via INTRAVENOUS
  Administered 2019-12-13: 100 ug via INTRAVENOUS
  Administered 2019-12-13 (×2): 50 ug via INTRAVENOUS

## 2019-12-13 MED ORDER — 0.9 % SODIUM CHLORIDE (POUR BTL) OPTIME
TOPICAL | Status: DC | PRN
  Administered 2019-12-13: 1000 mL

## 2019-12-13 MED ORDER — POLYETHYLENE GLYCOL 3350 17 G PO PACK: 17.0000 g | PACK | Freq: Every day | ORAL | Status: DC | PRN

## 2019-12-13 MED ORDER — PROPOFOL 10 MG/ML IV BOLUS
INTRAVENOUS | Status: DC | PRN
  Administered 2019-12-13: 130 mg via INTRAVENOUS

## 2019-12-13 MED ORDER — MIDAZOLAM HCL 2 MG/2ML IJ SOLN
INTRAMUSCULAR | Status: DC | PRN
  Administered 2019-12-13: 2 mg via INTRAVENOUS

## 2019-12-13 MED ORDER — ROCURONIUM BROMIDE 10 MG/ML (PF) SYRINGE
PREFILLED_SYRINGE | INTRAVENOUS | Status: DC | PRN
  Administered 2019-12-13: 80 mg via INTRAVENOUS

## 2019-12-13 MED ORDER — FENTANYL CITRATE (PF) 100 MCG/2ML IJ SOLN: 25.0000 ug | INTRAMUSCULAR | Status: DC | PRN

## 2019-12-13 MED ORDER — ONDANSETRON HCL 4 MG PO TABS: 4.0000 mg | ORAL_TABLET | Freq: Four times a day (QID) | ORAL | 0 refills | Status: DC | PRN

## 2019-12-13 MED ORDER — PROPOFOL 10 MG/ML IV BOLUS
INTRAVENOUS | Status: AC
  Filled 2019-12-13: qty 20

## 2019-12-13 MED ORDER — METOCLOPRAMIDE HCL 5 MG PO TABS: 5.0000 mg | ORAL_TABLET | Freq: Three times a day (TID) | ORAL | Status: DC | PRN

## 2019-12-13 MED ORDER — HYDROCODONE-ACETAMINOPHEN 5-325 MG PO TABS: 1.0000 | ORAL_TABLET | Freq: Four times a day (QID) | ORAL | 0 refills | Status: DC | PRN

## 2019-12-13 MED ORDER — ACETAMINOPHEN 10 MG/ML IV SOLN: 1000.0000 mg | Freq: Once | INTRAVENOUS | Status: DC | PRN

## 2019-12-13 MED ORDER — SUGAMMADEX SODIUM 200 MG/2ML IV SOLN
INTRAVENOUS | Status: DC | PRN
  Administered 2019-12-13: 200 mg via INTRAVENOUS

## 2019-12-13 MED ORDER — MIDAZOLAM HCL 2 MG/2ML IJ SOLN
INTRAMUSCULAR | Status: AC
  Filled 2019-12-13: qty 2

## 2019-12-13 MED ORDER — OXYCODONE HCL 5 MG PO TABS
ORAL_TABLET | ORAL | Status: AC
  Filled 2019-12-13: qty 1

## 2019-12-13 MED ORDER — PHENYLEPHRINE HCL (PRESSORS) 10 MG/ML IV SOLN
INTRAVENOUS | Status: DC | PRN
  Administered 2019-12-13: 40 ug via INTRAVENOUS

## 2019-12-13 MED ORDER — OXYCODONE HCL 5 MG PO TABS
5.0000 mg | ORAL_TABLET | Freq: Once | ORAL | Status: AC | PRN
  Administered 2019-12-13: 5 mg via ORAL

## 2019-12-13 MED ORDER — CEFAZOLIN SODIUM-DEXTROSE 2-4 GM/100ML-% IV SOLN
2.0000 g | Freq: Three times a day (TID) | INTRAVENOUS | Status: AC
  Administered 2019-12-13 – 2019-12-14 (×3): 2 g via INTRAVENOUS
  Filled 2019-12-13 (×3): qty 100

## 2019-12-13 MED ORDER — ONDANSETRON HCL 4 MG PO TABS: 4.0000 mg | ORAL_TABLET | Freq: Four times a day (QID) | ORAL | Status: DC | PRN

## 2019-12-13 MED ORDER — ONDANSETRON HCL 4 MG/2ML IJ SOLN
INTRAMUSCULAR | Status: DC | PRN
  Administered 2019-12-13: 4 mg via INTRAVENOUS

## 2019-12-13 MED ORDER — METOCLOPRAMIDE HCL 5 MG/ML IJ SOLN: 5.0000 mg | Freq: Three times a day (TID) | INTRAMUSCULAR | Status: DC | PRN

## 2019-12-13 MED ORDER — LACTATED RINGERS IV SOLN: INTRAVENOUS | Status: DC

## 2019-12-13 MED ORDER — METHOCARBAMOL 500 MG PO TABS: 500.0000 mg | ORAL_TABLET | Freq: Four times a day (QID) | ORAL | 0 refills | Status: DC | PRN

## 2019-12-13 MED ORDER — DEXAMETHASONE SODIUM PHOSPHATE 10 MG/ML IJ SOLN
INTRAMUSCULAR | Status: DC | PRN
  Administered 2019-12-13: 10 mg via INTRAVENOUS

## 2019-12-13 MED ORDER — OXYCODONE HCL 5 MG/5ML PO SOLN: 5.0000 mg | Freq: Once | ORAL | Status: AC | PRN

## 2019-12-13 SURGICAL SUPPLY — 53 items
APL PRP STRL LF DISP 70% ISPRP (MISCELLANEOUS) ×2
BIT DRILL 4.2 (DRILL) IMPLANT
BIT DRILL CALIBRATED 4.2 (BIT) IMPLANT
BLADE SURG 10 STRL SS (BLADE) ×4 IMPLANT
BNDG ELASTIC 4X5.8 VLCR STR LF (GAUZE/BANDAGES/DRESSINGS) ×2 IMPLANT
BNDG ELASTIC 6X5.8 VLCR STR LF (GAUZE/BANDAGES/DRESSINGS) ×2 IMPLANT
BRUSH SCRUB EZ PLAIN DRY (MISCELLANEOUS) ×4 IMPLANT
CHLORAPREP W/TINT 26 (MISCELLANEOUS) ×3 IMPLANT
CLSR STERI-STRIP ANTIMIC 1/2X4 (GAUZE/BANDAGES/DRESSINGS) ×1 IMPLANT
COVER SURGICAL LIGHT HANDLE (MISCELLANEOUS) ×3 IMPLANT
DRAPE C-ARM 42X72 X-RAY (DRAPES) ×2 IMPLANT
DRAPE C-ARMOR (DRAPES) ×2 IMPLANT
DRAPE HALF SHEET 40X57 (DRAPES) ×4 IMPLANT
DRAPE IMP U-DRAPE 54X76 (DRAPES) ×4 IMPLANT
DRAPE ORTHO SPLIT 77X108 STRL (DRAPES) ×4
DRAPE SURG ORHT 6 SPLT 77X108 (DRAPES) ×2 IMPLANT
DRAPE U-SHAPE 47X51 STRL (DRAPES) ×2 IMPLANT
DRILL 4.2 (DRILL) ×4
DRILL BIT CALIBRATED 4.2 (BIT) ×2
DRSG MEPILEX BORDER 4X4 (GAUZE/BANDAGES/DRESSINGS) ×1 IMPLANT
ELECT REM PT RETURN 9FT ADLT (ELECTROSURGICAL) ×2
ELECTRODE REM PT RTRN 9FT ADLT (ELECTROSURGICAL) ×1 IMPLANT
GAUZE SPONGE 4X4 12PLY STRL (GAUZE/BANDAGES/DRESSINGS) ×2 IMPLANT
GLOVE BIO SURGEON STRL SZ 6.5 (GLOVE) ×5 IMPLANT
GLOVE BIO SURGEON STRL SZ7.5 (GLOVE) ×7 IMPLANT
GLOVE BIOGEL PI IND STRL 6.5 (GLOVE) ×1 IMPLANT
GLOVE BIOGEL PI IND STRL 7.5 (GLOVE) ×1 IMPLANT
GLOVE BIOGEL PI INDICATOR 6.5 (GLOVE) ×1
GLOVE BIOGEL PI INDICATOR 7.5 (GLOVE) ×1
GOWN STRL REUS W/ TWL LRG LVL3 (GOWN DISPOSABLE) ×2 IMPLANT
GOWN STRL REUS W/TWL LRG LVL3 (GOWN DISPOSABLE) ×6
GUIDEWIRE 3.2X400 (WIRE) ×1 IMPLANT
KIT BASIN OR (CUSTOM PROCEDURE TRAY) ×2 IMPLANT
KIT TURNOVER KIT B (KITS) ×2 IMPLANT
NAIL TIBIA 11X360MM (Nail) ×1 IMPLANT
PACK TOTAL JOINT (CUSTOM PROCEDURE TRAY) ×2 IMPLANT
PAD ARMBOARD 7.5X6 YLW CONV (MISCELLANEOUS) ×3 IMPLANT
PAD CAST 4YDX4 CTTN HI CHSV (CAST SUPPLIES) IMPLANT
PADDING CAST COTTON 4X4 STRL (CAST SUPPLIES) ×2
PADDING CAST COTTON 6X4 STRL (CAST SUPPLIES) ×1 IMPLANT
REAMER ROD DEEP FLUTE 2.5X950 (INSTRUMENTS) ×1 IMPLANT
SCREW LOCK STAR 5X38 (Screw) ×2 IMPLANT
SCREW LOCK STAR 5X46 (Screw) ×1 IMPLANT
SCREW LOCK STAR 5X48 (Screw) ×1 IMPLANT
SCREW LOCK STAR 5X62 (Screw) ×1 IMPLANT
STAPLER VISISTAT 35W (STAPLE) ×2 IMPLANT
SUT MNCRL AB 3-0 PS2 18 (SUTURE) ×2 IMPLANT
SUT VIC AB 0 CT1 27 (SUTURE) ×2
SUT VIC AB 0 CT1 27XBRD ANBCTR (SUTURE) IMPLANT
SUT VIC AB 2-0 CT1 27 (SUTURE) ×2
SUT VIC AB 2-0 CT1 TAPERPNT 27 (SUTURE) IMPLANT
TOWEL GREEN STERILE (TOWEL DISPOSABLE) ×3 IMPLANT
TOWEL GREEN STERILE FF (TOWEL DISPOSABLE) ×2 IMPLANT

## 2019-12-13 NOTE — Progress Notes (Signed)
Orthopedic Tech Progress Note Patient Details:  Brandon Neal 1950-08-07 [id number] Applied Overhead Trapeze Patient ID: Brandon Neal, male   DOB: 10-19-1950, 69 y.o.   MRN: [id number]   Brandon Neal 12/13/2019, 4:22 PM

## 2019-12-13 NOTE — Anesthesia Preprocedure Evaluation (Signed)
Anesthesia Evaluation   Patient awake    Reviewed: Allergy & Precautions, NPO status , Patient's Chart, lab work & pertinent test results  History of Anesthesia Complications Negative for: history of anesthetic complications  Airway Mallampati: II  TM Distance: >3 FB Neck ROM: Full    Dental  (+) Dental Advisory Given, Teeth Intact,    Pulmonary neg COPD, neg recent URI, Current Smoker and Patient abstained from smoking.,    breath sounds clear to auscultation       Cardiovascular negative cardio ROS   Rhythm:Regular     Neuro/Psych negative neurological ROS  negative psych ROS   GI/Hepatic negative GI ROS, Neg liver ROS,   Endo/Other  negative endocrine ROS  Renal/GU negative Renal ROS     Musculoskeletal Left tibia fracture   Abdominal   Peds  Hematology negative hematology ROS (+)   Anesthesia Other Findings   Reproductive/Obstetrics                             Anesthesia Physical Anesthesia Plan  ASA: II  Anesthesia Plan: General   Post-op Pain Management:    Induction: Intravenous  PONV Risk Score and Plan: 1 and Ondansetron and Dexamethasone  Airway Management Planned: Oral ETT  Additional Equipment: None  Intra-op Plan:   Post-operative Plan: Extubation in OR  Informed Consent: I have reviewed the patients History and Physical, chart, labs and discussed the procedure including the risks, benefits and alternatives for the proposed anesthesia with the patient or authorized representative who has indicated his/her understanding and acceptance.     Dental advisory given  Plan Discussed with: CRNA and Surgeon  Anesthesia Plan Comments:         Anesthesia Quick Evaluation

## 2019-12-13 NOTE — Transfer of Care (Signed)
Immediate Anesthesia Transfer of Care Note  Patient: Brandon Neal  Procedure(s) Performed: INTRAMEDULLARY (IM) NAIL TIBIAL (Left )  Patient Location: PACU  Anesthesia Type:General  Level of Consciousness: drowsy  Airway & Oxygen Therapy: Patient Spontanous Breathing and Patient connected to nasal cannula oxygen  Post-op Assessment: Report given to RN and Post -op Vital signs reviewed and stable  Post vital signs: Reviewed and stable  Last Vitals:  Vitals Value Taken Time  BP    Temp    Pulse    Resp    SpO2      Last Pain:  Vitals:   12/13/19 0915  TempSrc:   PainSc: (P) 0-No pain         Complications: No complications documented.

## 2019-12-13 NOTE — Op Note (Signed)
Orthopaedic Surgery Operative Note (CSN: [id number] ) Date of Surgery: 12/13/2019  Admit Date: 12/12/2019   Diagnoses: Pre-Op Diagnoses: Left closed tibia and fibula fractuer  Post-Op Diagnosis: Same  Procedures: 1. CPT 27759-Intramedullary nailing of left tibia 2. CPT 77071-External rotation stress exam of left ankle  Surgeons : Primary: Tandrea Kommer, Thomasene Lot, MD  Assistant: Patrecia Pace, PA-C  Location: OR 5   Anesthesia:General  Antibiotics: Ancef 2g preop with 1 gm vancomycin powder placed topically   Tourniquet time:None  Estimated Blood XTKW:40 mL  Complications:None  Specimens:None   Implants: Implant Name Type Inv. Item Serial No. Manufacturer Lot No. LRB No. Used Action  NAIL TIBIA [id number] - [id number] Nail NAIL TIBIA [id number]  [employer] [id number] Left 1 Implanted  SCREW LOCK STAR 5X46 - [id number] Screw SCREW LOCK STAR 5X46  [employer]  Left 1 Implanted  SCREW LOCK STAR 5X38 - [id number] Screw SCREW LOCK STAR 5X38  [employer]  Left 2 Implanted  SCREW LOCK STAR 5X48 - [id number] Screw SCREW LOCK STAR 5X48  [employer]  Left 1 Implanted  SCREW LOCK STAR 5X62 - [id number] Screw SCREW LOCK STAR 5X62  [employer]  Left 1 Implanted     Indications for Surgery: 69 year old male who was cutting a tree the chainsaw kicked back and pushed the tree branch into his leg he had immediate pain and deformity.  He was brought to emergency room where x-rays showed a distal tibia and segmental fibula fracture.  Due to the unstable nature of his injury I recommended proceeding with intramedullary nailing of the left tibia.  Risks and benefits were discussed with the patient and his wife.  Risks include but not limited to bleeding, infection, malunion, nonunion, hardware failure, hardware irritation, nerve and blood vessel injury, DVT, even the possibility anesthetic complications.  The patient agreed to proceed with surgery and consent was  obtained.  Operative Findings: 1.  Intramedullary nailing of left tibial shaft fracture using Synthes EX tibial nail 11 x 360 mm 2.  External rotation stress view of the ankle showing stable syndesmosis post fixation  Procedure: The patient was identified in the preoperative holding area. Consent was confirmed with the patient and their family and all questions were answered. The operative extremity was marked after confirmation with the patient. he was then brought back to the operating room by our anesthesia colleagues.  He was then placed under general anesthetic and carefully transferred over to a radiolucent flat top table.  A bump was placed under his operative hip.  The left lower extremity was then prepped and draped in usual sterile fashion.  A timeout was performed to verify the patient, the procedure, and the extremity.  Preoperative antibiotics were dosed.  Fluoroscopic imaging was obtained to show the unstable nature of his injury.  A lateral parapatellar approach was carried down through skin and subcutaneous tissue.  I released the retinaculum laterally to be able to mobilize the patella medially to get the appropriate starting point.  I then directed a threaded guidewire into the proximal metaphysis using fluoroscopy as a guide.  I confirmed placement and then used an entry reamer to enter the medullary canal.  I then passed a ball-tipped guidewire down the center of the canal.  Reduction maneuver was performed at the fracture.  I then seated it into the distal segment into the physeal scar.  I measured the length of the nail and chose to use a 360 mm nail.  I then sequentially reamed from 8.22mm to  12 mm.  I chose to use an 11 mm nail.  I then passed a 11 x 360 mm nail down the center of the canal.  The reduction was near anatomic.  I seated it until the proximal portion of the nail was buried on the lateral view of the knee.  Using perfect circle technique I then placed a 3 distal  interlocking screws.  2 from medial to lateral and another oblique distal screw.  I then used the targeting arm proximally to place 2 distal interlocking in the proximal segment.  Final fluoroscopic imaging was obtained.  The incision was copiously irrigated.  A gram of vancomycin powder was placed into the incisions.  A layer closure of 0 Vicryl, 2-0 Vicryl and 3-0 Monocryl was used.  Steri-Strips were placed.  A sterile dressing was applied.  The patient was placed in a boot.  He was awoken from anesthesia and taken to the PACU in stable condition.  Post Op Plan/Instructions: The patient will be nonweightbearing to the left lower extremity.  He will receive postoperative Ancef.  He will be started on Lovenox for DVT prophylaxis while in the hospital and be discharged home on aspirin 325 mg twice daily.  We will have him mobilize with physical and Occupational Therapy.  I was present and performed the entire surgery.  Patrecia Pace, PA-C did assist me throughout the case. An assistant was necessary given the difficulty in approach, maintenance of reduction and ability to instrument the fracture.   Katha Hamming, MD [facility]

## 2019-12-13 NOTE — Interval H&P Note (Signed)
History and Physical Interval Note:  12/13/2019 7:15 AM  Brandon Neal  has presented today for surgery, with the diagnosis of Left tibia fracture.  The various methods of treatment have been discussed with the patient and family. After consideration of risks, benefits and other options for treatment, the patient has consented to  Procedure(s): INTRAMEDULLARY (IM) NAIL TIBIAL (Left) as a surgical intervention.  The patient's history has been reviewed, patient examined, no change in status, stable for surgery.  I have reviewed the patient's chart and labs.  Questions were answered to the patient's satisfaction.     Lennette Bihari P Zurie Platas

## 2019-12-13 NOTE — Anesthesia Procedure Notes (Signed)
Procedure Name: Intubation Date/Time: 12/13/2019 7:41 AM Performed by: Clearnce Sorrel, CRNA Pre-anesthesia Checklist: Patient identified, Emergency Drugs available, Suction available, Patient being monitored and Timeout performed Patient Re-evaluated:Patient Re-evaluated prior to induction Oxygen Delivery Method: Circle system utilized Preoxygenation: Pre-oxygenation with 100% oxygen Induction Type: IV induction Ventilation: Mask ventilation without difficulty Laryngoscope Size: Mac and 4 Grade View: Grade I Tube type: Oral Tube size: 7.5 mm Number of attempts: 1 Airway Equipment and Method: Stylet Placement Confirmation: ETT inserted through vocal cords under direct vision,  positive ETCO2 and breath sounds checked- equal and bilateral Secured at: 23 cm Tube secured with: Tape Dental Injury: Teeth and Oropharynx as per pre-operative assessment

## 2019-12-13 NOTE — Discharge Instructions (Signed)
[facility] Discharge Instructions   General Discharge Instructions  WEIGHT BEARING STATUS:Non-weightbearing on left lower extremity  RANGE OF MOTION/ACTIVITY:Okay to come out of black CAM boot to begin gentle ankle range of motion. Sleep in the boot until seen back in clinic  Wound Care: You are okay to remove your surgical dressings on post-operative day #2. Leave the yellow steri-strips in place.  Incisions can be left open to air if there is no drainage. If incision continues to have drainage, follow wound care instructions below. Okay to shower if no drainage from incisions.  DVT/PE prophylaxis: Aspirin 325 mg twice daily x 30 days  Diet: as you were eating previously.  Can use over the counter stool softeners and bowel preparations, such as Miralax, to help with bowel movements.  Narcotics can be constipating.  Be sure to drink plenty of fluids  PAIN MEDICATION USE AND EXPECTATIONS  You have likely been given narcotic medications to help control your pain.  After a traumatic event that results in an fracture (broken bone) with or without surgery, it is ok to use narcotic pain medications to help control one's pain.  We understand that everyone responds to pain differently and each individual patient will be evaluated on a regular basis for the continued need for narcotic medications. Ideally, narcotic medication use should last no more than 6-8 weeks (coinciding with fracture healing).   As a patient it is your responsibility as well to monitor narcotic medication use and report the amount and frequency you use these medications when you come to your office visit.   We would also advise that if you are using narcotic medications, you should take a dose prior to therapy to maximize you participation.  IF YOU ARE ON NARCOTIC MEDICATIONS IT IS NOT PERMISSIBLE TO OPERATE A MOTOR VEHICLE (MOTORCYCLE/CAR/TRUCK/MOPED) OR HEAVY MACHINERY DO NOT MIX NARCOTICS WITH OTHER CNS  (CENTRAL NERVOUS SYSTEM) DEPRESSANTS SUCH AS ALCOHOL   STOP SMOKING OR USING NICOTINE PRODUCTS!!!!  As discussed nicotine severely impairs your body's ability to heal surgical and traumatic wounds but also impairs bone healing.  Wounds and bone heal by forming microscopic blood vessels (angiogenesis) and nicotine is a vasoconstrictor (essentially, shrinks blood vessels).  Therefore, if vasoconstriction occurs to these microscopic blood vessels they essentially disappear and are unable to deliver necessary nutrients to the healing tissue.  This is one modifiable factor that you can do to dramatically increase your chances of healing your injury.    (This means no smoking, no nicotine gum, patches, etc)  DO NOT USE NONSTEROIDAL ANTI-INFLAMMATORY DRUGS (NSAID'S)  Using products such as Advil (ibuprofen), Aleve (naproxen), Motrin (ibuprofen) for additional pain control during fracture healing can delay and/or prevent the healing response.  If you would like to take over the counter (OTC) medication, Tylenol (acetaminophen) is ok.  However, some narcotic medications that are given for pain control contain acetaminophen as well. Therefore, you should not exceed more than 4000 mg of tylenol in a day if you do not have liver disease.  Also note that there are may OTC medicines, such as cold medicines and allergy medicines that my contain tylenol as well.  If you have any questions about medications and/or interactions please ask your doctor/PA or your pharmacist.      ICE AND ELEVATE INJURED/OPERATIVE EXTREMITY  Using ice and elevating the injured extremity above your heart can help with swelling and pain control.  Icing in a pulsatile fashion, such as 20 minutes on and 20  minutes off, can be followed.    Do not place ice directly on skin. Make sure there is a barrier between to skin and the ice pack.    Using frozen items such as frozen peas works well as the conform nicely to the are that needs to be  iced.  USE AN ACE WRAP OR TED HOSE FOR SWELLING CONTROL  In addition to icing and elevation, Ace wraps or TED hose are used to help limit and resolve swelling.  It is recommended to use Ace wraps or TED hose until you are informed to stop.    When using Ace Wraps start the wrapping distally (farthest away from the body) and wrap proximally (closer to the body)   Example: If you had surgery on your leg or thing and you do not have a splint on, start the ace wrap at the toes and work your way up to the thigh        If you had surgery on your upper extremity and do not have a splint on, start the ace wrap at your fingers and work your way up to the upper arm  IF YOU ARE IN A CAM BOOT (BLACK BOOT)  You may remove boot periodically. Perform daily dressing changes as noted below.  Wash the liner of the boot regularly and wear a sock when wearing the boot. It is recommended that you sleep in the boot until told otherwise   CALL THE OFFICE WITH ANY QUESTIONS OR CONCERNS: [phone]   VISIT OUR WEBSITE FOR ADDITIONAL INFORMATION: [email or web address]     Discharge Wound Care Instructions  Do NOT apply any ointments, solutions or lotions to pin sites or surgical wounds.  These prevent needed drainage and even though solutions like hydrogen peroxide kill bacteria, they also damage cells lining the pin sites that help fight infection.  Applying lotions or ointments can keep the wounds moist and can cause them to breakdown and open up as well. This can increase the risk for infection. When in doubt call the office.  Surgical incisions should be dressed daily.  If any drainage is noted, use one layer of adaptic, then gauze, Kerlix, and an ace wrap.  Once the incision is completely dry and without drainage, it may be left open to air out.  Showering may begin 36-48 hours later.  Cleaning gently with soap and water.  Traumatic wounds should be dressed daily as well.    One layer of adaptic, gauze,  Kerlix, then ace wrap.  The adaptic can be discontinued once the draining has ceased    If you have a wet to dry dressing: wet the gauze with saline the squeeze as much saline out so the gauze is moist (not soaking wet), place moistened gauze over wound, then place a dry gauze over the moist one, followed by Kerlix wrap, then ace wrap.

## 2019-12-13 NOTE — Plan of Care (Signed)
  Problem: Education: Goal: Knowledge of General Education information will improve Description: Including pain rating scale, medication(s)/side effects and non-pharmacologic comfort measures Outcome: Progressing   Problem: Health Behavior/Discharge Planning: Goal: Ability to manage health-related needs will improve Outcome: Progressing   Problem: Clinical Measurements: Goal: Will remain free from infection Outcome: Progressing   Problem: Activity: Goal: Risk for activity intolerance will decrease Outcome: Progressing   Problem: Nutrition: Goal: Adequate nutrition will be maintained Outcome: Progressing   Problem: Elimination: Goal: Will not experience complications related to bowel motility Outcome: Progressing   Problem: Pain Managment: Goal: General experience of comfort will improve Outcome: Progressing   

## 2019-12-13 NOTE — Progress Notes (Signed)
Orthopedic Tech Progress Note Patient Details:  Brandon Neal May 24, 1950 [id number]  Ortho Devices Type of Ortho Device: CAM walker Ortho Device/Splint Location: Left Lower Extremity Ortho Device/Splint Interventions: Ordered, Adjustment, Application   Post Interventions Patient Tolerated: Well Instructions Provided: Adjustment of device, Care of device, Poper ambulation with device   Brandon Neal 12/13/2019, 5:15 PM

## 2019-12-13 NOTE — Evaluation (Signed)
Physical Therapy Evaluation Patient Details Name: Brandon Neal MRN: [id number] DOB: May 21, 1950 Today's Date: 12/13/2019   History of Present Illness  69 year old male, L leg struck by tree. Resulted in left tibia/fibula fracture. S/p Lt IM nail Tibia  Clinical Impression  Patient is s/p above surgery resulting in functional limitations due to the deficits listed below (see PT Problem List). Demonstrates good strength and understanding, mobilizing with RW, NWB through LLE. Wife present and very supportive. Pt eager to return home. Wife eager for patient to return home safely. She can assist with ADLs. Patient will benefit from skilled PT to increase their independence and safety with mobility to allow discharge to the venue listed below.       Follow Up Recommendations Home health PT;Supervision - Intermittent (home safety eval, gait training, and progression of strengthening exercises to hasten atrophy of LLE while NWB.)    Equipment Recommendations  Rolling walker with 5" wheels    Recommendations for Other Services       Precautions / Restrictions Precautions Precautions: Fall Restrictions Weight Bearing Restrictions: Yes LLE Weight Bearing: Non weight bearing      Mobility  Bed Mobility Overal bed mobility: Independent                Transfers Overall transfer level: Needs assistance Equipment used: Rolling walker (2 wheeled) Transfers: Sit to/from Stand Sit to Stand: Supervision         General transfer comment: supervision for safety. Educated on safe hand placement and technique to maintain NWB through LLE. Performed with adequate strength from bed similar to height at home.  Ambulation/Gait Ambulation/Gait assistance: Min guard;+2 safety/equipment Gait Distance (Feet): 20 Feet Assistive device: Rolling walker (2 wheeled) Gait Pattern/deviations:  (hop - NWB Lt) Gait velocity: decreased   General Gait Details: Good strength through UEs. Min guard for  safety. Educated on proper DME use, to allow RW to stop advancing prior to taking next step. Maintains NWB safely through LLE. Chair follow from wife for safety.  Stairs            Wheelchair Mobility    Modified Rankin (Stroke Patients Only)       Balance Overall balance assessment: Needs assistance Sitting-balance support: No upper extremity supported;Feet supported Sitting balance-Leahy Scale: Normal     Standing balance support: Single extremity supported Standing balance-Leahy Scale: Poor                               Pertinent Vitals/Pain Pain Assessment: No/denies pain    Home Living Family/patient expects to be discharged to:: Private residence Living Arrangements: Spouse/significant other Available Help at Discharge: Family;Available 24 hours/day Type of Home: House Home Access: Stairs to enter Entrance Stairs-Rails: Left Entrance Stairs-Number of Steps: 1 Home Layout: One level Home Equipment: Shower seat;Bedside commode;Crutches      Prior Function Level of Independence: Independent         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 Dominance   Dominant Hand: Right    Extremity/Trunk Assessment   Upper Extremity Assessment Upper Extremity Assessment: Defer to OT evaluation    Lower Extremity Assessment Lower Extremity Assessment: LLE deficits/detail LLE: Unable to fully assess due to immobilization LLE Sensation: WNL (Toes assessed)       Communication   Communication: No difficulties  Cognition Arousal/Alertness: Awake/alert Behavior During Therapy: WFL for tasks assessed/performed Overall Cognitive Status: Within Functional Limits for tasks  assessed                                        General Comments General comments (skin integrity, edema, etc.): Demonstrated stair transfer with RW, to be practiced at next session. Reviewed spirometer use.    Exercises General Exercises - Lower Extremity Ankle  Circles/Pumps: AROM;Both;10 reps;Seated   Assessment/Plan    PT Assessment Patient needs continued PT services  PT Problem List Decreased strength;Decreased range of motion;Decreased activity tolerance;Decreased balance;Decreased mobility;Decreased knowledge of use of DME;Decreased knowledge of precautions       PT Treatment Interventions DME instruction;Gait training;Stair training;Functional mobility training;Therapeutic activities;Therapeutic exercise;Balance training;Patient/family education;Modalities    PT Goals (Current goals can be found in the Care Plan section)  Acute Rehab PT Goals Patient Stated Goal: Go home soon PT Goal Formulation: With patient Time For Goal Achievement: 12/27/19 Potential to Achieve Goals: Good    Frequency Min 3X/week   Barriers to discharge        Co-evaluation               AM-PAC PT "6 Clicks" Mobility  Outcome Measure Help needed turning from your back to your side while in a flat bed without using bedrails?: None Help needed moving from lying on your back to sitting on the side of a flat bed without using bedrails?: None Help needed moving to and from a bed to a chair (including a wheelchair)?: A Little Help needed standing up from a chair using your arms (e.g., wheelchair or bedside chair)?: A Little Help needed to walk in hospital room?: A Little Help needed climbing 3-5 steps with a railing? : A Lot 6 Click Score: 19    End of Session Equipment Utilized During Treatment: Gait belt Activity Tolerance: Patient tolerated treatment well Patient left: in chair;with call bell/phone within reach;with nursing/sitter in room;with family/visitor present;with SCD's reapplied Nurse Communication: Mobility status;Weight bearing status PT Visit Diagnosis: Difficulty in walking, not elsewhere classified (R26.2)    Time: 1550-1611 PT Time Calculation (min) (ACUTE ONLY): 21 min   Charges:   PT Evaluation $PT Eval Low Complexity: [place], PT, DPT  Ellouise Newer 12/13/2019, 4:25 PM

## 2019-12-14 LAB — CBC
HCT: 35.9 % — ABNORMAL LOW (ref 39.0–52.0)
Hemoglobin: 12.2 g/dL — ABNORMAL LOW (ref 13.0–17.0)
MCH: 31.9 pg (ref 26.0–34.0)
MCHC: 34 g/dL (ref 30.0–36.0)
MCV: 94 fL (ref 80.0–100.0)
Platelets: 170 10*3/uL (ref 150–400)
RBC: 3.82 MIL/uL — ABNORMAL LOW (ref 4.22–5.81)
RDW: 13.2 % (ref 11.5–15.5)
WBC: 17.2 10*3/uL — ABNORMAL HIGH (ref 4.0–10.5)
nRBC: 0 % (ref 0.0–0.2)

## 2019-12-14 LAB — BASIC METABOLIC PANEL
Anion gap: 9 (ref 5–15)
BUN: 23 mg/dL (ref 8–23)
CO2: 23 mmol/L (ref 22–32)
Calcium: 8.7 mg/dL — ABNORMAL LOW (ref 8.9–10.3)
Chloride: 106 mmol/L (ref 98–111)
Creatinine, Ser: 1.33 mg/dL — ABNORMAL HIGH (ref 0.61–1.24)
GFR calc Af Amer: >60 mL/min (ref >60)
GFR calc non Af Amer: 55 mL/min — ABNORMAL LOW (ref >60)
Glucose, Bld: 136 mg/dL — ABNORMAL HIGH (ref 70–99)
Potassium: 4.3 mmol/L (ref 3.5–5.1)
Sodium: 138 mmol/L (ref 135–145)

## 2019-12-14 LAB — VITAMIN D 25 HYDROXY (VIT D DEFICIENCY, FRACTURES): Vit D, 25-Hydroxy: 32.13 ng/mL (ref 30–100)

## 2019-12-14 MED ORDER — POLYETHYLENE GLYCOL 3350 17 G PO PACK: PACK | ORAL | 0 refills | Status: DC

## 2019-12-14 MED ORDER — ENOXAPARIN SODIUM 40 MG/0.4ML ~~LOC~~ SOLN: 40.0000 mg | SUBCUTANEOUS | 0 refills | Status: DC

## 2019-12-14 MED ORDER — DOCUSATE SODIUM 100 MG PO CAPS: ORAL_CAPSULE | ORAL | 0 refills | Status: DC

## 2019-12-14 NOTE — Progress Notes (Signed)
Discharge summary provided to pt and spouse with instructions. Pt/spouse verbalized understanding of instructions. Dressing management has been discussed as indicated. No complaints voiced. Pt is alert/oriented in no acute distress.

## 2019-12-14 NOTE — Plan of Care (Signed)
  Problem: Education: Goal: Knowledge of General Education information will improve Description: Including pain rating scale, medication(s)/side effects and non-pharmacologic comfort measures Outcome: Adequate for Discharge   Problem: Health Behavior/Discharge Planning: Goal: Ability to manage health-related needs will improve Outcome: Adequate for Discharge   Problem: Activity: Goal: Risk for activity intolerance will decrease Outcome: Adequate for Discharge   Problem: Nutrition: Goal: Adequate nutrition will be maintained Outcome: Adequate for Discharge   Problem: Elimination: Goal: Will not experience complications related to bowel motility Outcome: Adequate for Discharge   Problem: Pain Managment: Goal: General experience of comfort will improve Outcome: Adequate for Discharge

## 2019-12-14 NOTE — Evaluation (Signed)
Occupational Therapy Evaluation Patient Details Name: Brandon Neal MRN: [id number] DOB: 05-29-1950 Today's Date: 12/14/2019    History of Present Illness 69 year old male, L leg struck by tree. Resulted in left tibia/fibula fracture. S/p Lt IM nail Tibia   Clinical Impression   This 69 y/o male presents with the above. PTA pt independent with ADL, iADL and functional mobility, was working. Pt currently requiring up to minguard assist for functional transfers using RW and for LB ADL. Pt's spouse present and supportive during session and will be able to assist with ADL/iADL PRN after return home. Educated both pt/pt's spouse re: safety and compensatory techniques for completing ADL and functional transfers given pt's NWB status. Pt with good carryover of NWB throughout functional tasks today. He will benefit from continued acute OT services to further maximize his safety and independence with ADL and mobility. Do not anticipate pt will require follow up OT services after discharge.     Follow Up Recommendations  No OT follow up;Supervision - Intermittent    Equipment Recommendations  None recommended by OT (pt's DME needs are met )           Precautions / Restrictions Precautions Precautions: Fall Required Braces or Orthoses: Other Brace Other Brace: L CAM boot  Restrictions Weight Bearing Restrictions: Yes LLE Weight Bearing: Non weight bearing      Mobility Bed Mobility Overal bed mobility: Independent             General bed mobility comments: OOB upon arrival  Transfers Overall transfer level: Needs assistance Equipment used: Rolling walker (2 wheeled) Transfers: Sit to/from Stand Sit to Stand: Supervision         General transfer comment: supervision for safety, pt with good return demonstration of safe hand placement, spouse present and also reinforcing safe hand placement with pt     Balance Overall balance assessment: Needs assistance Sitting-balance  support: No upper extremity supported;Feet supported Sitting balance-Leahy Scale: Good     Standing balance support: Bilateral upper extremity supported Standing balance-Leahy Scale: Poor Standing balance comment: reliant on at least single UE support given NWB status                            ADL either performed or assessed with clinical judgement   ADL Overall ADL's : Needs assistance/impaired Eating/Feeding: Modified independent;Sitting   Grooming: Set up;Sitting   Upper Body Bathing: Modified independent;Sitting   Lower Body Bathing: Supervison/ safety;Sitting/lateral leans;Sit to/from stand   Upper Body Dressing : Modified independent;Sitting   Lower Body Dressing: Min guard;Sitting/lateral leans;Sit to/from stand   Toilet Transfer: Min guard;Stand-pivot;Ambulation;RW;BSC Toilet Transfer Details (indicate cue type and reason): BSC over toilet; simulated via transfer to/from [place] and Hygiene: Min guard;Sitting/lateral lean;Sit to/from stand   Tub/ Shower Transfer: Walk-in shower;Min guard;Ambulation;Shower Technical sales engineer Details (indicate cue type and reason): pt able to perform hop over simulated shower ledge, educated in proper sequence with pt return demonstrating understanding; minguard for safety Functional mobility during ADLs: Min guard;Rolling walker General ADL Comments: educated in compensatory techniques for completing ADL and functional transfers including walker safety and one handed techniques given NWB status                          Pertinent Vitals/Pain Pain Assessment: No/denies pain     Hand Dominance Right   Extremity/Trunk Assessment Upper Extremity Assessment Upper  Extremity Assessment: Overall WFL for tasks assessed   Lower Extremity Assessment Lower Extremity Assessment: Defer to PT evaluation       Communication Communication Communication: No  difficulties   Cognition Arousal/Alertness: Awake/alert Behavior During Therapy: WFL for tasks assessed/performed Overall Cognitive Status: Within Functional Limits for tasks assessed                                     General Comments  spouse present during session    Exercises Exercises: Other exercises Other Exercises Other Exercises: Quad setting x10 bil LEs Other Exercises: Isometric hip abduction with belt x10 Other Exercises: Straight leg raise LLE x10 Other Exercises: Pillow squeeze x10 Other Exercises: Gluteal setting x10   Shoulder Instructions      Home Living Family/patient expects to be discharged to:: Private residence Living Arrangements: Spouse/significant other Available Help at Discharge: Family;Available 24 hours/day Type of Home: House Home Access: Stairs to enter [employer] of Steps: 1 Entrance Stairs-Rails: Left Home Layout: One level     Bathroom Shower/Tub: Walk-in shower;Door   [employer] Toilet: Standard     Home Equipment: Shower seat;Bedside commode;Crutches          Prior Functioning/Environment Level of Independence: Independent        Comments: active as a farmer        OT Problem List: Decreased strength;Decreased range of motion;Decreased activity tolerance;Decreased knowledge of use of DME or AE;Decreased knowledge of precautions;Impaired balance (sitting and/or standing)      OT Treatment/Interventions: Self-care/ADL training;Therapeutic exercise;DME and/or AE instruction;Therapeutic activities;Patient/family education;Balance training    OT Goals(Current goals can be found in the care plan section) Acute Rehab OT Goals Patient Stated Goal: Go home soon OT Goal Formulation: With patient Time For Goal Achievement: 12/28/19 Potential to Achieve Goals: Good  OT Frequency: Min 2X/week   Barriers to D/C:            Co-evaluation              AM-PAC OT "6 Clicks" Daily Activity      Outcome Measure Help from another person eating meals?: None Help from another person taking care of personal grooming?: A Little Help from another person toileting, which includes using toliet, bedpan, or urinal?: A Little Help from another person bathing (including washing, rinsing, drying)?: A Little Help from another person to put on and taking off regular upper body clothing?: None Help from another person to put on and taking off regular lower body clothing?: A Little 6 Click Score: 20   End of Session Equipment Utilized During Treatment: Gait belt;Rolling walker;Other (comment) (L CAM boot) Nurse Communication: Mobility status  Activity Tolerance: Patient tolerated treatment well Patient left: in chair;with bed alarm set;with family/visitor present  OT Visit Diagnosis: Other abnormalities of gait and mobility (R26.89)                Time: 1856-3149 OT Time Calculation (min): 15 min Charges:  OT General Charges $OT Visit: 1 Visit OT Evaluation $OT Eval Low Complexity: [place], OT Acute Rehabilitation Services Pager [phone] Office [phone]   Raymondo Band 12/14/2019, 11:33 AM

## 2019-12-14 NOTE — Discharge Summary (Signed)
Patient ID: Brandon Neal MRN: [id number] DOB/AGE: December 29, 1950 69 y.o.  Admit date: 12/12/2019 Discharge date: 12/14/2019  Admission Diagnoses:  Principal Problem:   Closed displaced oblique fracture of shaft of left tibia   Discharge Diagnoses:  Same  Past Medical History:  Diagnosis Date  . Hyperlipidemia     Surgeries: Procedure(s): INTRAMEDULLARY (IM) NAIL TIBIAL on 12/13/2019   Consultants: Treatment Team:  Shona Needles, MD  Discharged Condition: Improved  Hospital Course: Brandon Neal is an 69 y.o. male who was admitted 12/12/2019 for operative treatment ofClosed displaced oblique fracture of shaft of left tibia. Patient has severe unremitting pain that affects sleep, daily activities, and work/hobbies. After pre-op clearance the patient was taken to the operating room on 12/13/2019 and underwent  Procedure(s): INTRAMEDULLARY (IM) NAIL TIBIAL.    Patient was given perioperative antibiotics:  Anti-infectives (From admission, onward)   Start     Dose/Rate Route Frequency Ordered Stop   12/13/19 1600  ceFAZolin (ANCEF) IVPB 2g/100 mL premix        2 g 200 mL/hr over 30 Minutes Intravenous Every 8 hours 12/13/19 1036 12/14/19 0719   12/13/19 0844  vancomycin (VANCOCIN) powder  Status:  Discontinued          As needed 12/13/19 0913 12/13/19 0913   12/13/19 0800  ceFAZolin (ANCEF) IVPB 2g/100 mL premix        2 g 200 mL/hr over 30 Minutes Intravenous To [facility] 12/12/19 2332 12/13/19 0748       Patient was given sequential compression devices, early ambulation, and chemoprophylaxis to prevent DVT.  Patient benefited maximally from hospital stay and there were no complications.    Recent vital signs:  Patient Vitals for the past 24 hrs:  BP Temp Temp src Pulse Resp SpO2  12/14/19 0411 119/73 98.7 F (37.1 C) Oral 66 -- 93 %  12/14/19 0000 100/60 98 F (36.7 C) Oral 76 16 98 %  12/13/19 1920 107/63 97.9 F (36.6 C) Oral 89 16 93 %  12/13/19 1447  102/62 98.7 F (37.1 C) Oral 76 15 90 %  12/13/19 1104 (!) 111/56 98.6 F (37 C) Oral 68 17 90 %  12/13/19 1000 107/69 -- -- 68 14 91 %  12/13/19 0945 111/68 98.6 F (37 C) -- 74 17 91 %  12/13/19 0930 106/70 -- -- 73 16 90 %  12/13/19 0915 133/71 98.3 F (36.8 C) -- 68 17 93 %     Recent laboratory studies:  Recent Labs    12/13/19 0428 12/13/19 0428 12/14/19 0156  WBC 12.2*  --  17.2*  HGB 13.6  --  12.2*  HCT 41.3  --  35.9*  PLT 171  --  170  NA 139  --  138  K 4.4  --  4.3  CL 107  --  106  CO2 24  --  23  BUN 19  --  23  CREATININE 1.24  --  1.33*  GLUCOSE 130*  --  136*  CALCIUM 9.0   < > 8.7*   < > = values in this interval not displayed.     Discharge Medications:   Allergies as of 12/14/2019   No Known Allergies     Medication List    STOP taking these medications   multivitamin with minerals Tabs tablet     TAKE these medications   aspirin EC 325 MG tablet Take 1 tablet (325 mg total) by mouth in the morning and at  bedtime. What changed:   medication strength  how much to take  when to take this   docusate sodium 100 MG capsule Commonly known as: COLACE 1 tab 2 times a day while on narcotics.  STOOL SOFTENER   enoxaparin 40 MG/0.4ML injection Commonly known as: LOVENOX Inject 0.4 mLs (40 mg total) into the skin daily.   HYDROcodone-acetaminophen 5-325 MG tablet Commonly known as: NORCO/VICODIN Take 1-2 tablets by mouth every 6 (six) hours as needed for severe pain.   methocarbamol 500 MG tablet Commonly known as: Robaxin Take 1 tablet (500 mg total) by mouth every 6 (six) hours as needed for muscle spasms.   ondansetron 4 MG tablet Commonly known as: ZOFRAN Take 1 tablet (4 mg total) by mouth every 6 (six) hours as needed for nausea.   polyethylene glycol 17 g packet Commonly known as: MIRALAX / GLYCOLAX 17grams in 6 oz of coffee twice a day until bowel movement.  LAXITIVE.  Restart if two days since last bowel movement             Durable Medical Equipment  (From admission, onward)         Start     Ordered   12/13/19 1838  For home use only DME Walker rolling  Once       Question Answer Comment  Walker: With [place]   Patient needs a walker to treat with the following condition Closed displaced oblique fracture of shaft of left tibia      12/13/19 1838           Discharge Care Instructions  (From admission, onward)         Start     Ordered   12/14/19 0000  Leave dressing on - Keep it clean, dry, and intact until clinic visit        12/14/19 0755          Diagnostic Studies: DG Tibia/Fibula Left  Result Date: 12/13/2019 CLINICAL DATA:  Left tibial and fibular fractures EXAM: LEFT TIBIA AND FIBULA - 2 VIEW; DG C-ARM 1-60 MIN COMPARISON:  12/12/2018 FLUOROSCOPY TIME:  Fluoroscopy Time:  1 minutes 52 seconds Radiation Exposure Index (if provided by the fluoroscopic device): 4.08 mGy Number of Acquired Spot Images: 8 FINDINGS: Initial films again demonstrate the distal tibial and fibular fractures. Fractures with an reduced and a medullary rod placed in the tibia with proximal and distal fixation screws. Fracture fragments are in near anatomic alignment. IMPRESSION: Status post ORIF of left tibial fracture. Electronically Signed   By: Inez Catalina M.D.   On: 12/13/2019 10:47   DG Tibia/Fibula Left  Result Date: 12/12/2019 CLINICAL DATA:  Deformity in the lower leg after tree fell on the patient's leg. EXAM: LEFT TIBIA AND FIBULA - 2 VIEW COMPARISON:  Ankle radiographs from 12/12/2019 FINDINGS: Displaced and overlapped oblique fracture of the distal tibia diaphysis noted. Transverse fracture at the junction of the mid and distal thirds of the fibula with 1 bone width of posterior displacement of the distal fracture fragment and with 1.3 cm of overlap. Mildly comminuted oblique fracture in the proximal fibular diaphysis is likewise noted. Spurring at the quadriceps attachment to the patella. No  knee effusion. IMPRESSION: Displaced fractures of the tibia fibula as detailed above. Electronically Signed   By: Van Clines M.D.   On: 12/12/2019 18:42   DG Ankle Complete Left  Result Date: 12/12/2019 CLINICAL DATA:  A tree fell on the patient's leg with resulting like  pain and deformity. The. EXAM: LEFT ANKLE COMPLETE - 3+ VIEW COMPARISON:  None. FINDINGS: Displaced oblique fracture of the distal tibial shaft with 2.0 cm of overlap, 2.1 cm distal fragment lateral displacement, and 2.0 cm of distal fragment posterior displacement. A fracture at the junction of the mid and distal fibula is present, with 1.1 cm of overlap and 1.6 cm of posterior displacement of the distal fragment. Os peroneus noted. Plantar and Achilles calcaneal spurs. Plafond and talar dome appear intact. IMPRESSION: 1. Substantially displaced and overlapped fractures of the mid to distal tibial and fibular shafts. 2. Plantar calcaneal spurs. Electronically Signed   By: Van Clines M.D.   On: 12/12/2019 18:36   DG Tibia/Fibula Left Port  Result Date: 12/13/2019 CLINICAL DATA:  Fracture. EXAM: PORTABLE LEFT TIBIA AND FIBULA - 2 VIEW COMPARISON:  12/12/2019 FINDINGS: Interval placement of intramedullary tibial nail with associated proximal and distal anchoring screws bridging patient's distal diaphyseal fracture. Hardware is intact with near anatomic alignment over the fracture site. Reduction of patient's 2 fracture sites involving the proximal and distal aspect of the fibular diaphysis with near anatomic alignment over the fracture sites. IMPRESSION: 1. Internal fixation of distal tibial fracture with hardware intact and near anatomic alignment over the fracture site. 2. Reduction of patient's 2 fracture sites of the proximal and distal fibular diaphysis with near anatomic alignment over the fracture sites. Electronically Signed   By: Marin Olp M.D.   On: 12/13/2019 14:17   DG C-Arm 1-60 Min  Result Date:  12/13/2019 CLINICAL DATA:  Left tibial and fibular fractures EXAM: LEFT TIBIA AND FIBULA - 2 VIEW; DG C-ARM 1-60 MIN COMPARISON:  12/12/2018 FLUOROSCOPY TIME:  Fluoroscopy Time:  1 minutes 52 seconds Radiation Exposure Index (if provided by the fluoroscopic device): 4.08 mGy Number of Acquired Spot Images: 8 FINDINGS: Initial films again demonstrate the distal tibial and fibular fractures. Fractures with an reduced and a medullary rod placed in the tibia with proximal and distal fixation screws. Fracture fragments are in near anatomic alignment. IMPRESSION: Status post ORIF of left tibial fracture. Electronically Signed   By: Inez Catalina M.D.   On: 12/13/2019 10:47    Disposition: Discharge disposition: 01-Home or Self Care       Discharge Instructions    Diet - low sodium heart healthy   Complete by: As directed    Discharge instructions   Complete by: As directed    Ankle Fracture Care After Refer to this sheet in the next few weeks. These discharge instructions provide you with general information on caring for yourself after you leave the hospital. Your caregiver may also give you specific instructions. Your treatment has been planned according to the most current medical practices available, but unavoidable complications sometimes occur. If you have any problems or questions after discharge, please call your caregiver. HOME INSTRUCTIONS You may resume a normal diet and activities as directed. Walk with walker NON WEIGHT BEARING Do NOT get dressing wet.  Do NOT remove dressing until you come back to the doctor Only take over-the-counter or prescription medicines for pain, discomfort, or fever as directed by your caregiver.  Eat a well-balanced diet.  Avoid lifting or driving until you are instructed otherwise.  Make an appointment to see your caregiver for stitches (suture) or staple removal as directed.   SEEK MEDICAL CARE IF: You have swelling of your calf or leg.  You develop  shortness of breath or chest pain.  You have redness, swelling, or  increasing pain in the wound.  There is pus or any unusual drainage coming from the surgical site.  You notice a bad smell coming from the surgical site or dressing.  The surgical site breaks open after sutures or staples have been removed.  There is persistent bleeding from the suture or staple line.  You are getting worse or are not improving.  You have any other questions or concerns.  SEEK IMMEDIATE MEDICAL CARE IF:  You have a fever.  You develop a rash.  You have difficulty breathing.  You develop any reaction or side effects to medicines given.  Your knee motion is decreasing rather than improving.  MAKE SURE YOU:  Understand these instructions.  Will watch your condition.  Will get help right away if you are not doing well or get worse.   Increase activity slowly   Complete by: As directed    Leave dressing on - Keep it clean, dry, and intact until clinic visit   Complete by: As directed        Follow-up Information    Haddix, Thomasene Lot, MD. Schedule an appointment as soon as possible for a visit in 2 weeks.   Specialty: Orthopedic Surgery Why: For wound re-check, for repeat x-rays Contact information: [place] [address] [phone]                Signed: Linda Hedges 12/14/2019, 7:56 AM

## 2019-12-14 NOTE — Plan of Care (Signed)
  Problem: Education: Goal: Verbalization of understanding the information provided will improve Outcome: Progressing   Problem: Pain Management: Goal: Pain level will decrease Outcome: Progressing   Problem: Activity: Goal: Risk for activity intolerance will decrease Outcome: Progressing    Problem: Safety: Goal: Ability to remain free from injury will improve Outcome: Progressing   Problem: Skin Integrity: Goal: Risk for impaired skin integrity will decrease Outcome: Progressing  Problem: Skin Integrity: Goal: Risk for impaired skin integrity will decrease Outcome: Progressing

## 2019-12-14 NOTE — TOC Progression Note (Signed)
Transition of Care [facility]) - Progression Note    Patient Details  Name: Brandon Neal MRN: [id number] Date of Birth: [birth year]  [facility] CM/SW Contact  Claudie Leach, RN 12/14/2019, 9:00 AM  Clinical Narrative:    Discussed HH/DME needs with patient's wife per patient preference.  They have no preference of home health agency.  Contacted [facility], and [facility], who all declined due to insurance/staffing in [place].  Unable to contact [facility].  No other [place] agencies have contract with [employer].  Email sent to HTA contact, Janalyn Shy, who may be able to assist tomorrow.  Patient's wife states patient may decline outpatient PT, but agrees to have referral sent to [facility] in [place].  She declines OP rehab at [facility].  Referral placed to [facility] office.   Patient has a walker at home and does not require any other DME.  Wife states she will obtain anything that is needed from [place].       Barriers to Discharge: No Barriers Identified  Expected Discharge Plan and Services    Expected Discharge Date: 12/14/19

## 2019-12-14 NOTE — Progress Notes (Signed)
Physical Therapy Treatment Patient Details Name: Brandon Neal MRN: [id number] DOB: 21-Feb-1951 Today's Date: 12/14/2019    History of Present Illness 69 year old male, L leg struck by tree. Resulted in left tibia/fibula fracture. S/p Lt IM nail Tibia    PT Comments    Continuing work on functional mobility and activity tolerance;  Session focused on continiung gait training, and reinforcing stair training in prep for discharge home today; Taught therex to lessen the effects of atrophy while he must be NWB;  Overall he is managing well, and his wife will give assistance as needed; Questions solicited and answered; OK for dc home from PT standpoint    Follow Up Recommendations  Home health PT;Supervision - Intermittent     Equipment Recommendations  Rolling walker with 5" wheels    Recommendations for Other Services       Precautions / Restrictions Precautions Precautions: Fall Restrictions Weight Bearing Restrictions: Yes LLE Weight Bearing: Non weight bearing    Mobility  Bed Mobility Overal bed mobility: Independent                Transfers Overall transfer level: Needs assistance Equipment used: Rolling walker (2 wheeled) Transfers: Sit to/from Stand Sit to Stand: Supervision         General transfer comment: supervision for safety. Educated on safe hand placement and technique to maintain NWB through LLE. Performed with adequate strength from bed similar to height at home.  Ambulation/Gait Ambulation/Gait assistance: Min guard Gait Distance (Feet): 40 Feet Assistive device: Rolling walker (2 wheeled) Gait Pattern/deviations:  (Hop-to pattern) Gait velocity: decreased   General Gait Details: Good strength through UEs. Min guard for safety. Educated on proper DME use, to allow RW to stop advancing prior to taking next step. Maintains NWB safely through LLE. Chair follow from wife for safety.   Stairs Stairs: Yes Stairs assistance: Min guard Stair  Management: No rails;Backwards;With walker Number of Stairs: 1 General stair comments: Verbal and demo cues for sequence and technique; Managed one step well   Wheelchair Mobility    Modified Rankin (Stroke Patients Only)       Balance                                            Cognition Arousal/Alertness: Awake/alert Behavior During Therapy: WFL for tasks assessed/performed Overall Cognitive Status: Within Functional Limits for tasks assessed                                        Exercises Other Exercises Other Exercises: Quad setting x10 bil LEs Other Exercises: Isometric hip abduction with belt x10 Other Exercises: Straight leg raise LLE x10 Other Exercises: Pillow squeeze x10 Other Exercises: Gluteal setting x10    General Comments General comments (skin integrity, edema, etc.): Wife present throughout session; questions solicited and answered      Pertinent Vitals/Pain Pain Assessment: No/denies pain    Home Living                      Prior Function            PT Goals (current goals can now be found in the care plan section) Acute Rehab PT Goals Patient Stated Goal: Go home soon PT Goal Formulation: With  patient Time For Goal Achievement: 12/27/19 Potential to Achieve Goals: Good Progress towards PT goals: Progressing toward goals    Frequency    Min 3X/week      PT Plan Current plan remains appropriate    Co-evaluation              AM-PAC PT "6 Clicks" Mobility   Outcome Measure  Help needed turning from your back to your side while in a flat bed without using bedrails?: None Help needed moving from lying on your back to sitting on the side of a flat bed without using bedrails?: None Help needed moving to and from a bed to a chair (including a wheelchair)?: A Little Help needed standing up from a chair using your arms (e.g., wheelchair or bedside chair)?: A Little Help needed to walk in  hospital room?: A Little Help needed climbing 3-5 steps with a railing? : A Little 6 Click Score: 20    End of Session Equipment Utilized During Treatment: Gait belt Activity Tolerance: Patient tolerated treatment well Patient left: in chair;Other (comment) (Preparing to work with OT) Nurse Communication: Mobility status;Weight bearing status PT Visit Diagnosis: Difficulty in walking, not elsewhere classified (R26.2)     Time: 9728-2060 PT Time Calculation (min) (ACUTE ONLY): 25 min  Charges:  $Gait Training: 23-37 mins                     Roney Marion, [place] Pager [phone] Office [place] 12/14/2019, 10:05 AM

## 2019-12-14 NOTE — Anesthesia Postprocedure Evaluation (Signed)
Anesthesia Post Note  Patient: Brandon Neal  Procedure(s) Performed: INTRAMEDULLARY (IM) NAIL TIBIAL (Left )     Patient location during evaluation: PACU Anesthesia Type: General Level of consciousness: awake and alert Pain management: pain level controlled Vital Signs Assessment: post-procedure vital signs reviewed and stable Respiratory status: spontaneous breathing, nonlabored ventilation, respiratory function stable and patient connected to nasal cannula oxygen Cardiovascular status: blood pressure returned to baseline and stable Postop Assessment: no apparent nausea or vomiting Anesthetic complications: no   No complications documented.  Last Vitals:  Vitals:   12/14/19 0000 12/14/19 0411  BP: 100/60 119/73  Pulse: 76 66  Resp: 16   Temp: 36.7 C 37.1 C  SpO2: 98% 93%    Last Pain:  Vitals:   12/14/19 0411  TempSrc: Oral  PainSc:                  Tab Rylee

## 2019-12-15 ENCOUNTER — Encounter (HOSPITAL_COMMUNITY): Payer: Self-pay | Admitting: Student

## 2019-12-22 ENCOUNTER — Ambulatory Visit: Payer: PPO | Admitting: Physical Therapy

## 2019-12-30 DIAGNOSIS — S82202D Unspecified fracture of shaft of left tibia, subsequent encounter for closed fracture with routine healing: Secondary | ICD-10-CM | POA: Diagnosis not present

## 2020-01-07 DIAGNOSIS — S82302D Unspecified fracture of lower end of left tibia, subsequent encounter for closed fracture with routine healing: Secondary | ICD-10-CM | POA: Diagnosis not present

## 2020-01-07 DIAGNOSIS — S82492D Other fracture of shaft of left fibula, subsequent encounter for closed fracture with routine healing: Secondary | ICD-10-CM | POA: Diagnosis not present

## 2020-01-13 DIAGNOSIS — S82492D Other fracture of shaft of left fibula, subsequent encounter for closed fracture with routine healing: Secondary | ICD-10-CM | POA: Diagnosis not present

## 2020-01-13 DIAGNOSIS — S82302D Unspecified fracture of lower end of left tibia, subsequent encounter for closed fracture with routine healing: Secondary | ICD-10-CM | POA: Diagnosis not present

## 2020-01-15 DIAGNOSIS — S82492D Other fracture of shaft of left fibula, subsequent encounter for closed fracture with routine healing: Secondary | ICD-10-CM | POA: Diagnosis not present

## 2020-01-15 DIAGNOSIS — S82302D Unspecified fracture of lower end of left tibia, subsequent encounter for closed fracture with routine healing: Secondary | ICD-10-CM | POA: Diagnosis not present

## 2020-01-20 DIAGNOSIS — S82492D Other fracture of shaft of left fibula, subsequent encounter for closed fracture with routine healing: Secondary | ICD-10-CM | POA: Diagnosis not present

## 2020-01-20 DIAGNOSIS — S82302D Unspecified fracture of lower end of left tibia, subsequent encounter for closed fracture with routine healing: Secondary | ICD-10-CM | POA: Diagnosis not present

## 2020-01-22 DIAGNOSIS — S82492D Other fracture of shaft of left fibula, subsequent encounter for closed fracture with routine healing: Secondary | ICD-10-CM | POA: Diagnosis not present

## 2020-01-22 DIAGNOSIS — S82302D Unspecified fracture of lower end of left tibia, subsequent encounter for closed fracture with routine healing: Secondary | ICD-10-CM | POA: Diagnosis not present

## 2020-01-26 DIAGNOSIS — S82492D Other fracture of shaft of left fibula, subsequent encounter for closed fracture with routine healing: Secondary | ICD-10-CM | POA: Diagnosis not present

## 2020-01-26 DIAGNOSIS — S82302D Unspecified fracture of lower end of left tibia, subsequent encounter for closed fracture with routine healing: Secondary | ICD-10-CM | POA: Diagnosis not present

## 2020-01-27 DIAGNOSIS — S82202D Unspecified fracture of shaft of left tibia, subsequent encounter for closed fracture with routine healing: Secondary | ICD-10-CM | POA: Diagnosis not present

## 2020-01-29 DIAGNOSIS — S82492D Other fracture of shaft of left fibula, subsequent encounter for closed fracture with routine healing: Secondary | ICD-10-CM | POA: Diagnosis not present

## 2020-01-29 DIAGNOSIS — S82302D Unspecified fracture of lower end of left tibia, subsequent encounter for closed fracture with routine healing: Secondary | ICD-10-CM | POA: Diagnosis not present

## 2020-02-03 DIAGNOSIS — S82492D Other fracture of shaft of left fibula, subsequent encounter for closed fracture with routine healing: Secondary | ICD-10-CM | POA: Diagnosis not present

## 2020-02-03 DIAGNOSIS — S82302D Unspecified fracture of lower end of left tibia, subsequent encounter for closed fracture with routine healing: Secondary | ICD-10-CM | POA: Diagnosis not present

## 2020-02-05 DIAGNOSIS — S82302D Unspecified fracture of lower end of left tibia, subsequent encounter for closed fracture with routine healing: Secondary | ICD-10-CM | POA: Diagnosis not present

## 2020-02-05 DIAGNOSIS — S82492D Other fracture of shaft of left fibula, subsequent encounter for closed fracture with routine healing: Secondary | ICD-10-CM | POA: Diagnosis not present

## 2020-02-10 DIAGNOSIS — S82492D Other fracture of shaft of left fibula, subsequent encounter for closed fracture with routine healing: Secondary | ICD-10-CM | POA: Diagnosis not present

## 2020-02-10 DIAGNOSIS — S82302D Unspecified fracture of lower end of left tibia, subsequent encounter for closed fracture with routine healing: Secondary | ICD-10-CM | POA: Diagnosis not present

## 2020-02-12 DIAGNOSIS — S82492D Other fracture of shaft of left fibula, subsequent encounter for closed fracture with routine healing: Secondary | ICD-10-CM | POA: Diagnosis not present

## 2020-02-12 DIAGNOSIS — S82302D Unspecified fracture of lower end of left tibia, subsequent encounter for closed fracture with routine healing: Secondary | ICD-10-CM | POA: Diagnosis not present

## 2020-02-17 DIAGNOSIS — S82302D Unspecified fracture of lower end of left tibia, subsequent encounter for closed fracture with routine healing: Secondary | ICD-10-CM | POA: Diagnosis not present

## 2020-02-17 DIAGNOSIS — S82492D Other fracture of shaft of left fibula, subsequent encounter for closed fracture with routine healing: Secondary | ICD-10-CM | POA: Diagnosis not present

## 2020-02-19 DIAGNOSIS — S82492D Other fracture of shaft of left fibula, subsequent encounter for closed fracture with routine healing: Secondary | ICD-10-CM | POA: Diagnosis not present

## 2020-02-19 DIAGNOSIS — S82302D Unspecified fracture of lower end of left tibia, subsequent encounter for closed fracture with routine healing: Secondary | ICD-10-CM | POA: Diagnosis not present

## 2020-02-24 DIAGNOSIS — S82302D Unspecified fracture of lower end of left tibia, subsequent encounter for closed fracture with routine healing: Secondary | ICD-10-CM | POA: Diagnosis not present

## 2020-02-24 DIAGNOSIS — S82492D Other fracture of shaft of left fibula, subsequent encounter for closed fracture with routine healing: Secondary | ICD-10-CM | POA: Diagnosis not present

## 2020-02-26 DIAGNOSIS — S82302D Unspecified fracture of lower end of left tibia, subsequent encounter for closed fracture with routine healing: Secondary | ICD-10-CM | POA: Diagnosis not present

## 2020-02-26 DIAGNOSIS — S82492D Other fracture of shaft of left fibula, subsequent encounter for closed fracture with routine healing: Secondary | ICD-10-CM | POA: Diagnosis not present

## 2020-03-02 DIAGNOSIS — S82302D Unspecified fracture of lower end of left tibia, subsequent encounter for closed fracture with routine healing: Secondary | ICD-10-CM | POA: Diagnosis not present

## 2020-03-02 DIAGNOSIS — S82492D Other fracture of shaft of left fibula, subsequent encounter for closed fracture with routine healing: Secondary | ICD-10-CM | POA: Diagnosis not present

## 2020-03-04 DIAGNOSIS — S82302D Unspecified fracture of lower end of left tibia, subsequent encounter for closed fracture with routine healing: Secondary | ICD-10-CM | POA: Diagnosis not present

## 2020-03-04 DIAGNOSIS — S82492D Other fracture of shaft of left fibula, subsequent encounter for closed fracture with routine healing: Secondary | ICD-10-CM | POA: Diagnosis not present

## 2020-03-08 DIAGNOSIS — S82492D Other fracture of shaft of left fibula, subsequent encounter for closed fracture with routine healing: Secondary | ICD-10-CM | POA: Diagnosis not present

## 2020-03-08 DIAGNOSIS — S82302D Unspecified fracture of lower end of left tibia, subsequent encounter for closed fracture with routine healing: Secondary | ICD-10-CM | POA: Diagnosis not present

## 2020-03-09 DIAGNOSIS — S82202D Unspecified fracture of shaft of left tibia, subsequent encounter for closed fracture with routine healing: Secondary | ICD-10-CM | POA: Diagnosis not present

## 2020-04-19 DIAGNOSIS — E118 Type 2 diabetes mellitus with unspecified complications: Secondary | ICD-10-CM | POA: Diagnosis not present

## 2020-04-19 DIAGNOSIS — E785 Hyperlipidemia, unspecified: Secondary | ICD-10-CM | POA: Diagnosis not present

## 2020-04-19 DIAGNOSIS — Z79899 Other long term (current) drug therapy: Secondary | ICD-10-CM | POA: Diagnosis not present

## 2020-04-19 DIAGNOSIS — I2510 Atherosclerotic heart disease of native coronary artery without angina pectoris: Secondary | ICD-10-CM | POA: Diagnosis not present

## 2020-04-19 DIAGNOSIS — I700 Atherosclerosis of aorta: Secondary | ICD-10-CM | POA: Diagnosis not present

## 2020-04-26 DIAGNOSIS — E785 Hyperlipidemia, unspecified: Secondary | ICD-10-CM | POA: Diagnosis not present

## 2020-04-26 DIAGNOSIS — R7309 Other abnormal glucose: Secondary | ICD-10-CM | POA: Diagnosis not present

## 2020-04-26 DIAGNOSIS — I700 Atherosclerosis of aorta: Secondary | ICD-10-CM | POA: Diagnosis not present

## 2020-04-26 DIAGNOSIS — R7303 Prediabetes: Secondary | ICD-10-CM | POA: Diagnosis not present

## 2020-04-26 DIAGNOSIS — Z23 Encounter for immunization: Secondary | ICD-10-CM | POA: Diagnosis not present

## 2020-06-03 ENCOUNTER — Other Ambulatory Visit (HOSPITAL_COMMUNITY): Payer: Self-pay | Admitting: Internal Medicine

## 2020-06-03 DIAGNOSIS — Z720 Tobacco use: Secondary | ICD-10-CM

## 2020-06-08 DIAGNOSIS — S82202D Unspecified fracture of shaft of left tibia, subsequent encounter for closed fracture with routine healing: Secondary | ICD-10-CM | POA: Diagnosis not present

## 2020-06-30 ENCOUNTER — Ambulatory Visit (HOSPITAL_COMMUNITY): Payer: PPO

## 2020-06-30 ENCOUNTER — Encounter (HOSPITAL_COMMUNITY): Payer: Self-pay

## 2020-07-19 DIAGNOSIS — I700 Atherosclerosis of aorta: Secondary | ICD-10-CM | POA: Diagnosis not present

## 2020-07-19 DIAGNOSIS — J439 Emphysema, unspecified: Secondary | ICD-10-CM | POA: Diagnosis not present

## 2020-07-19 DIAGNOSIS — E785 Hyperlipidemia, unspecified: Secondary | ICD-10-CM | POA: Diagnosis not present

## 2020-07-19 DIAGNOSIS — Z79899 Other long term (current) drug therapy: Secondary | ICD-10-CM | POA: Diagnosis not present

## 2020-07-19 DIAGNOSIS — E118 Type 2 diabetes mellitus with unspecified complications: Secondary | ICD-10-CM | POA: Diagnosis not present

## 2020-07-26 DIAGNOSIS — I700 Atherosclerosis of aorta: Secondary | ICD-10-CM | POA: Diagnosis not present

## 2020-07-26 DIAGNOSIS — J439 Emphysema, unspecified: Secondary | ICD-10-CM | POA: Diagnosis not present

## 2020-09-21 ENCOUNTER — Encounter (HOSPITAL_COMMUNITY): Payer: Self-pay

## 2020-09-21 ENCOUNTER — Ambulatory Visit (HOSPITAL_COMMUNITY): Payer: PPO

## 2020-09-28 ENCOUNTER — Ambulatory Visit (HOSPITAL_COMMUNITY)
Admission: RE | Admit: 2020-09-28 | Discharge: 2020-09-28 | Disposition: A | Discharge status: Home / Self Care | Payer: PPO | Source: Ambulatory Visit | Attending: Internal Medicine | Admitting: Internal Medicine

## 2020-09-28 DIAGNOSIS — I700 Atherosclerosis of aorta: Secondary | ICD-10-CM | POA: Diagnosis not present

## 2020-09-28 DIAGNOSIS — I2510 Atherosclerotic heart disease of native coronary artery without angina pectoris: Secondary | ICD-10-CM | POA: Insufficient documentation

## 2020-09-28 DIAGNOSIS — J432 Centrilobular emphysema: Secondary | ICD-10-CM | POA: Diagnosis not present

## 2020-09-28 DIAGNOSIS — F17210 Nicotine dependence, cigarettes, uncomplicated: Secondary | ICD-10-CM | POA: Insufficient documentation

## 2020-09-28 DIAGNOSIS — Z122 Encounter for screening for malignant neoplasm of respiratory organs: Secondary | ICD-10-CM | POA: Insufficient documentation

## 2020-09-28 DIAGNOSIS — Z720 Tobacco use: Secondary | ICD-10-CM | POA: Insufficient documentation

## 2021-04-12 DIAGNOSIS — Z23 Encounter for immunization: Secondary | ICD-10-CM | POA: Diagnosis not present

## 2021-04-29 DIAGNOSIS — E785 Hyperlipidemia, unspecified: Secondary | ICD-10-CM | POA: Diagnosis not present

## 2021-04-29 DIAGNOSIS — J310 Chronic rhinitis: Secondary | ICD-10-CM | POA: Diagnosis not present

## 2021-06-21 DIAGNOSIS — M542 Cervicalgia: Secondary | ICD-10-CM | POA: Diagnosis not present

## 2021-06-21 DIAGNOSIS — R079 Chest pain, unspecified: Secondary | ICD-10-CM | POA: Diagnosis not present

## 2021-07-22 DIAGNOSIS — E785 Hyperlipidemia, unspecified: Secondary | ICD-10-CM | POA: Diagnosis not present

## 2021-07-22 DIAGNOSIS — I700 Atherosclerosis of aorta: Secondary | ICD-10-CM | POA: Diagnosis not present

## 2021-07-22 DIAGNOSIS — Z125 Encounter for screening for malignant neoplasm of prostate: Secondary | ICD-10-CM | POA: Diagnosis not present

## 2021-07-22 DIAGNOSIS — R7301 Impaired fasting glucose: Secondary | ICD-10-CM | POA: Diagnosis not present

## 2021-07-22 DIAGNOSIS — Z79899 Other long term (current) drug therapy: Secondary | ICD-10-CM | POA: Diagnosis not present

## 2021-07-22 DIAGNOSIS — J439 Emphysema, unspecified: Secondary | ICD-10-CM | POA: Diagnosis not present

## 2021-07-29 ENCOUNTER — Other Ambulatory Visit (HOSPITAL_COMMUNITY): Payer: Self-pay | Admitting: Internal Medicine

## 2021-07-29 ENCOUNTER — Other Ambulatory Visit: Payer: Self-pay | Admitting: Internal Medicine

## 2021-07-29 DIAGNOSIS — Z122 Encounter for screening for malignant neoplasm of respiratory organs: Secondary | ICD-10-CM

## 2021-07-29 DIAGNOSIS — Z79899 Other long term (current) drug therapy: Secondary | ICD-10-CM | POA: Diagnosis not present

## 2021-07-29 DIAGNOSIS — Z0001 Encounter for general adult medical examination with abnormal findings: Secondary | ICD-10-CM | POA: Diagnosis not present

## 2021-07-29 DIAGNOSIS — E1169 Type 2 diabetes mellitus with other specified complication: Secondary | ICD-10-CM | POA: Diagnosis not present

## 2021-07-29 DIAGNOSIS — S56921A Laceration of unspecified muscles, fascia and tendons at forearm level, right arm, initial encounter: Secondary | ICD-10-CM | POA: Diagnosis not present

## 2021-07-29 DIAGNOSIS — J439 Emphysema, unspecified: Secondary | ICD-10-CM | POA: Diagnosis not present

## 2021-07-29 DIAGNOSIS — R7309 Other abnormal glucose: Secondary | ICD-10-CM | POA: Diagnosis not present

## 2021-07-29 DIAGNOSIS — E1159 Type 2 diabetes mellitus with other circulatory complications: Secondary | ICD-10-CM | POA: Diagnosis not present

## 2021-07-29 DIAGNOSIS — I700 Atherosclerosis of aorta: Secondary | ICD-10-CM | POA: Diagnosis not present

## 2021-07-29 DIAGNOSIS — Z6831 Body mass index (BMI) 31.0-31.9, adult: Secondary | ICD-10-CM | POA: Diagnosis not present

## 2021-07-29 DIAGNOSIS — I10 Essential (primary) hypertension: Secondary | ICD-10-CM | POA: Diagnosis not present

## 2021-07-29 DIAGNOSIS — I2510 Atherosclerotic heart disease of native coronary artery without angina pectoris: Secondary | ICD-10-CM | POA: Diagnosis not present

## 2021-07-29 DIAGNOSIS — E785 Hyperlipidemia, unspecified: Secondary | ICD-10-CM | POA: Diagnosis not present
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
## 2021-09-06 ENCOUNTER — Ambulatory Visit (HOSPITAL_COMMUNITY)
Admission: RE | Admit: 2021-09-06 | Discharge: 2021-09-06 | Disposition: A | Discharge status: Home / Self Care | Payer: PPO | Source: Ambulatory Visit | Attending: Internal Medicine | Admitting: Internal Medicine

## 2021-09-06 DIAGNOSIS — F17210 Nicotine dependence, cigarettes, uncomplicated: Secondary | ICD-10-CM | POA: Diagnosis not present

## 2021-09-06 DIAGNOSIS — Z122 Encounter for screening for malignant neoplasm of respiratory organs: Secondary | ICD-10-CM | POA: Diagnosis not present

## 2021-09-06 DIAGNOSIS — J439 Emphysema, unspecified: Secondary | ICD-10-CM | POA: Diagnosis not present

## 2021-09-06 DIAGNOSIS — I700 Atherosclerosis of aorta: Secondary | ICD-10-CM | POA: Diagnosis not present

## 2021-09-06 DIAGNOSIS — I2510 Atherosclerotic heart disease of native coronary artery without angina pectoris: Secondary | ICD-10-CM | POA: Diagnosis not present

## 2021-11-18 ENCOUNTER — Encounter (INDEPENDENT_AMBULATORY_CARE_PROVIDER_SITE_OTHER): Payer: Self-pay | Admitting: *Deleted

## 2021-11-23 DIAGNOSIS — I10 Essential (primary) hypertension: Secondary | ICD-10-CM | POA: Diagnosis not present

## 2021-11-23 DIAGNOSIS — E118 Type 2 diabetes mellitus with unspecified complications: Secondary | ICD-10-CM | POA: Diagnosis not present

## 2021-11-23 DIAGNOSIS — R059 Cough, unspecified: Secondary | ICD-10-CM | POA: Diagnosis not present

## 2021-11-23 DIAGNOSIS — E1169 Type 2 diabetes mellitus with other specified complication: Secondary | ICD-10-CM | POA: Diagnosis not present

## 2022-03-20 DIAGNOSIS — E118 Type 2 diabetes mellitus with unspecified complications: Secondary | ICD-10-CM | POA: Diagnosis not present

## 2022-03-27 DIAGNOSIS — E1169 Type 2 diabetes mellitus with other specified complication: Secondary | ICD-10-CM | POA: Diagnosis not present

## 2022-03-27 DIAGNOSIS — I10 Essential (primary) hypertension: Secondary | ICD-10-CM | POA: Diagnosis not present

## 2022-03-27 DIAGNOSIS — R7309 Other abnormal glucose: Secondary | ICD-10-CM | POA: Diagnosis not present

## 2022-04-07 DIAGNOSIS — Z23 Encounter for immunization: Secondary | ICD-10-CM | POA: Diagnosis not present

## 2022-07-18 ENCOUNTER — Telehealth (INDEPENDENT_AMBULATORY_CARE_PROVIDER_SITE_OTHER): Payer: Self-pay | Admitting: Gastroenterology

## 2022-07-18 NOTE — Telephone Encounter (Signed)
Who is your primary care physician: Asencion Noble   Reasons for the colonoscopy: 10 year recall   Have you had a colonoscopy before?  Yes 2016  Do you have family history of colon cancer? No  Previous colonoscopy with polyps removed? No  Do you have a history colorectal cancer?   No  Are you diabetic? If yes, Type 1 or Type 2?    No  Do you have a prosthetic or mechanical heart valve? No  Do you have a pacemaker/defibrillator?   No  Have you had endocarditis/atrial fibrillation? No  Have you had joint replacement within the last 12 months?  No  Do you tend to be constipated or have to use laxatives? No  Do you have any history of drugs or alchohol?  No  Do you use supplemental oxygen?  No  Have you had a stroke or heart attack within the last 6 months?No  Do you take weight loss medication? No   Do you take any blood-thinning medications such as: (aspirin, warfarin, Plavix, Aggrenox)  Yes  If yes we need the name, milligram, dosage and who is prescribing doctor Aspirin 81 mg at night- Asencion Noble  Current Outpatient Medications on File Prior to Visit  Medication Sig Dispense Refill   atorvastatin (LIPITOR) 40 MG tablet Take 40 mg by mouth daily.     olmesartan (BENICAR) 20 MG tablet Take 20 mg by mouth daily.     No current facility-administered medications on file prior to visit.    No Known Allergies   Pharmacy: [facility]   Primary Insurance Name: [employer] number where you can be reached: [phone]

## 2022-07-18 NOTE — Telephone Encounter (Signed)
Any room Thanks 

## 2022-07-19 MED ORDER — PEG 3350-KCL-NA BICARB-NACL 420 G PO SOLR: 4000.0000 mL | Freq: Once | ORAL | 0 refills | Status: AC

## 2022-07-19 NOTE — Addendum Note (Signed)
Addended by: Vicente Males on: 07/19/2022 09:28 AM   Modules accepted: Orders

## 2022-07-19 NOTE — Telephone Encounter (Signed)
Pt contacted. Colonoscopy scheduled for 08/15/22. Instructions mailed to patient. Prep sent to pharmacy. Pt will be notified of pre op appt.

## 2022-07-20 NOTE — Telephone Encounter (Signed)
Pre op appt: 08/10/22 Telephone call.  Pt contacted and verbalized understanding.

## 2022-07-28 DIAGNOSIS — I700 Atherosclerosis of aorta: Secondary | ICD-10-CM | POA: Diagnosis not present

## 2022-07-28 DIAGNOSIS — Z79899 Other long term (current) drug therapy: Secondary | ICD-10-CM | POA: Diagnosis not present

## 2022-07-28 DIAGNOSIS — E118 Type 2 diabetes mellitus with unspecified complications: Secondary | ICD-10-CM | POA: Diagnosis not present

## 2022-07-28 DIAGNOSIS — E785 Hyperlipidemia, unspecified: Secondary | ICD-10-CM | POA: Diagnosis not present

## 2022-07-28 DIAGNOSIS — I10 Essential (primary) hypertension: Secondary | ICD-10-CM | POA: Diagnosis not present

## 2022-07-28 DIAGNOSIS — Z125 Encounter for screening for malignant neoplasm of prostate: Secondary | ICD-10-CM | POA: Diagnosis not present

## 2022-08-04 DIAGNOSIS — I10 Essential (primary) hypertension: Secondary | ICD-10-CM | POA: Diagnosis not present

## 2022-08-04 DIAGNOSIS — E785 Hyperlipidemia, unspecified: Secondary | ICD-10-CM | POA: Diagnosis not present

## 2022-08-04 DIAGNOSIS — E1169 Type 2 diabetes mellitus with other specified complication: Secondary | ICD-10-CM | POA: Diagnosis not present

## 2022-08-04 DIAGNOSIS — I700 Atherosclerosis of aorta: Secondary | ICD-10-CM | POA: Diagnosis not present

## 2022-08-10 ENCOUNTER — Encounter (HOSPITAL_COMMUNITY)
Admission: RE | Admit: 2022-08-10 | Discharge: 2022-08-10 | Disposition: A | Discharge status: Home / Self Care | Payer: PPO | Source: Ambulatory Visit | Attending: Gastroenterology | Admitting: Gastroenterology

## 2022-08-10 ENCOUNTER — Encounter (HOSPITAL_COMMUNITY): Payer: Self-pay

## 2022-08-10 ENCOUNTER — Other Ambulatory Visit: Payer: Self-pay

## 2022-08-14 ENCOUNTER — Telehealth (INDEPENDENT_AMBULATORY_CARE_PROVIDER_SITE_OTHER): Payer: Self-pay | Admitting: Gastroenterology

## 2022-08-14 NOTE — Telephone Encounter (Signed)
Pt wife left voicemail stating she had some questions regarding appt.  Called pt wife Brandon Neal back and she states hospital told pt to arrive at 9 am tomorrow. Instructions state to drink other half of prep at 5 am-advised pt wife that would need to be moved up to 3 am and pt would need to be finished by 5 am.  Pt wife verbalized understanding.

## 2022-08-15 ENCOUNTER — Encounter (HOSPITAL_COMMUNITY): Payer: Self-pay | Admitting: Gastroenterology

## 2022-08-15 ENCOUNTER — Encounter (INDEPENDENT_AMBULATORY_CARE_PROVIDER_SITE_OTHER): Payer: Self-pay | Admitting: *Deleted

## 2022-08-15 ENCOUNTER — Encounter (HOSPITAL_COMMUNITY)
Admission: RE | Disposition: A | Discharge status: Home / Self Care | Payer: Self-pay | Source: Home / Self Care | Attending: Gastroenterology

## 2022-08-15 ENCOUNTER — Ambulatory Visit (HOSPITAL_COMMUNITY): Payer: PPO | Admitting: Anesthesiology

## 2022-08-15 ENCOUNTER — Ambulatory Visit (HOSPITAL_BASED_OUTPATIENT_CLINIC_OR_DEPARTMENT_OTHER): Payer: PPO | Admitting: Anesthesiology

## 2022-08-15 ENCOUNTER — Other Ambulatory Visit: Payer: Self-pay

## 2022-08-15 ENCOUNTER — Ambulatory Visit (HOSPITAL_COMMUNITY)
Admission: RE | Admit: 2022-08-15 | Discharge: 2022-08-15 | Disposition: A | Discharge status: Home / Self Care | Payer: PPO | Attending: Gastroenterology | Admitting: Gastroenterology

## 2022-08-15 DIAGNOSIS — Z1211 Encounter for screening for malignant neoplasm of colon: Secondary | ICD-10-CM

## 2022-08-15 DIAGNOSIS — F17200 Nicotine dependence, unspecified, uncomplicated: Secondary | ICD-10-CM | POA: Diagnosis not present

## 2022-08-15 DIAGNOSIS — D122 Benign neoplasm of ascending colon: Secondary | ICD-10-CM | POA: Insufficient documentation

## 2022-08-15 DIAGNOSIS — K635 Polyp of colon: Secondary | ICD-10-CM | POA: Diagnosis not present

## 2022-08-15 DIAGNOSIS — D123 Benign neoplasm of transverse colon: Secondary | ICD-10-CM | POA: Insufficient documentation

## 2022-08-15 DIAGNOSIS — I10 Essential (primary) hypertension: Secondary | ICD-10-CM | POA: Insufficient documentation

## 2022-08-15 DIAGNOSIS — K648 Other hemorrhoids: Secondary | ICD-10-CM

## 2022-08-15 HISTORY — PX: COLONOSCOPY WITH PROPOFOL: SHX5780

## 2022-08-15 HISTORY — PX: POLYPECTOMY: SHX5525

## 2022-08-15 LAB — HM COLONOSCOPY

## 2022-08-15 SURGERY — COLONOSCOPY WITH PROPOFOL
Anesthesia: General

## 2022-08-15 MED ORDER — LACTATED RINGERS IV SOLN: INTRAVENOUS | Status: DC

## 2022-08-15 MED ORDER — EPHEDRINE SULFATE-NACL 50-0.9 MG/10ML-% IV SOSY
PREFILLED_SYRINGE | INTRAVENOUS | Status: DC | PRN
  Administered 2022-08-15 (×3): 5 mg via INTRAVENOUS

## 2022-08-15 MED ORDER — PROPOFOL 10 MG/ML IV BOLUS
INTRAVENOUS | Status: DC | PRN
  Administered 2022-08-15: 100 mg via INTRAVENOUS

## 2022-08-15 MED ORDER — EPHEDRINE 5 MG/ML INJ
INTRAVENOUS | Status: AC
  Filled 2022-08-15: qty 5

## 2022-08-15 MED ORDER — LIDOCAINE HCL (CARDIAC) PF 100 MG/5ML IV SOSY
PREFILLED_SYRINGE | INTRAVENOUS | Status: DC | PRN
  Administered 2022-08-15: 50 mg via INTRAVENOUS

## 2022-08-15 MED ORDER — STERILE WATER FOR IRRIGATION IR SOLN
Status: DC | PRN
  Administered 2022-08-15: 60 mL

## 2022-08-15 MED ORDER — PROPOFOL 500 MG/50ML IV EMUL
INTRAVENOUS | Status: DC | PRN
  Administered 2022-08-15: 150 ug/kg/min via INTRAVENOUS

## 2022-08-15 MED ORDER — PHENYLEPHRINE 80 MCG/ML (10ML) SYRINGE FOR IV PUSH (FOR BLOOD PRESSURE SUPPORT)
PREFILLED_SYRINGE | INTRAVENOUS | Status: DC | PRN
  Administered 2022-08-15: 160 ug via INTRAVENOUS

## 2022-08-15 NOTE — Transfer of Care (Signed)
Immediate Anesthesia Transfer of Care Note  Patient: Brandon Neal  Procedure(s) Performed: COLONOSCOPY WITH PROPOFOL POLYPECTOMY  Patient Location: Short Stay  Anesthesia Type:General  Level of Consciousness: awake  Airway & Oxygen Therapy: Patient Spontanous Breathing  Post-op Assessment: Report given to RN and Post -op Vital signs reviewed and stable  Post vital signs: Reviewed and stable  Last Vitals:  Vitals Value Taken Time  BP    Temp    Pulse    Resp    SpO2      Last Pain:  Vitals:   08/15/22 1028  TempSrc:   PainSc: 0-No pain      Patients Stated Pain Goal: 10 (08/15/22 0904)  Complications: No notable events documented.

## 2022-08-15 NOTE — Anesthesia Postprocedure Evaluation (Signed)
Anesthesia Post Note  Patient: JOSEAN LYCAN  Procedure(s) Performed: COLONOSCOPY WITH PROPOFOL POLYPECTOMY  Patient location during evaluation: Phase II Anesthesia Type: General Level of consciousness: awake and alert and oriented Pain management: pain level controlled Vital Signs Assessment: post-procedure vital signs reviewed and stable Respiratory status: spontaneous breathing, nonlabored ventilation and respiratory function stable Cardiovascular status: blood pressure returned to baseline and stable Postop Assessment: no apparent nausea or vomiting Anesthetic complications: no  No notable events documented.   Last Vitals:  Vitals:   08/15/22 0916 08/15/22 1059  BP: 120/67 (!) 92/54  Pulse: 60 (!) 55  Resp: (!) 21 20  Temp: 36.8 C 37.1 C  SpO2: 96% 97%    Last Pain:  Vitals:   08/15/22 1059  TempSrc: Axillary  PainSc: 0-No pain                 Fidelia Cathers C Zayra Devito

## 2022-08-15 NOTE — Discharge Instructions (Signed)
You are being discharged to home.  Resume your previous diet.  We are waiting for your pathology results.  Your physician has recommended a repeat colonoscopy for surveillance based on pathology results.  

## 2022-08-15 NOTE — H&P (Signed)
Brandon Neal is an 72 y.o. male.   Chief Complaint: CRC screening HPI: 72 y/o M with PMH HTN, coming for screening colonoscopy.  Last colonoscopy performed in 2013, no polyps were found.  The patient denies having any complaints such as melena, hematochezia, abdominal pain or distention, change in her bowel movement consistency or frequency, no changes in weight recently.  No family history of colorectal cancer.   Past Medical History:  Diagnosis Date   Hyperlipidemia     Past Surgical History:  Procedure Laterality Date   CATARACT EXTRACTION W/PHACO Right 02/25/2018   Procedure: CATARACT EXTRACTION PHACO AND INTRAOCULAR LENS PLACEMENT RIGHT EYE CDE=9.47;  Surgeon: Gemma Payor, MD;  Location: [facility];  Service: Ophthalmology;  Laterality: Right;  right   CATARACT EXTRACTION W/PHACO Left 04/22/2018   Procedure: CATARACT EXTRACTION PHACO AND INTRAOCULAR LENS PLACEMENT (IOC);  Surgeon: Gemma Payor, MD;  Location: [facility];  Service: Ophthalmology;  Laterality: Left;  CDE: 9.04   COLONOSCOPY  11/23/2011   Procedure: COLONOSCOPY;  Surgeon: Malissa Hippo, MD;  Location: [facility];  Service: Endoscopy;  Laterality: N/A;  830   KNEE SURGERY Right    TIBIA IM NAIL INSERTION Left 12/13/2019   Procedure: INTRAMEDULLARY (IM) NAIL TIBIAL;  Surgeon: Roby Lofts, MD;  Location: [facility];  Service: Orthopedics;  Laterality: Left;    History reviewed. No pertinent family history. Social History:  reports that he has been smoking. He has a 50.00 pack-year smoking history. He has never used smokeless tobacco. He reports that he does not drink alcohol and does not use drugs.  Allergies: No Known Allergies  Medications Prior to Admission  Medication Sig Dispense Refill   aspirin EC 81 MG tablet Take 81 mg by mouth at bedtime. Swallow whole.     atorvastatin (LIPITOR) 40 MG tablet Take 40 mg by mouth daily.     olmesartan (BENICAR) 20 MG tablet Take 20 mg by mouth daily.      Aspirin-Acetaminophen-Caffeine (GOODY HEADACHE PO) Take 1 packet by mouth daily as needed (pain).     ibuprofen (ADVIL) 800 MG tablet Take 800 mg by mouth 3 (three) times daily as needed for moderate pain.     oxymetazoline (AFRIN) 0.05 % nasal spray Place 1 spray into both nostrils at bedtime as needed for congestion.      No results found for this or any previous visit (from the past 48 hour(s)). No results found.  Review of Systems  All other systems reviewed and are negative.   Blood pressure 120/67, pulse 60, temperature 98.3 F (36.8 C), temperature source Oral, resp. rate (!) 21, SpO2 96 %. Physical Exam  GENERAL: The patient is AO x3, in no acute distress. HEENT: Head is normocephalic and atraumatic. EOMI are intact. Mouth is well hydrated and without lesions. NECK: Supple. No masses LUNGS: Clear to auscultation. No presence of rhonchi/wheezing/rales. Adequate chest expansion HEART: RRR, normal s1 and s2. ABDOMEN: Soft, nontender, no guarding, no peritoneal signs, and nondistended. BS +. No masses. EXTREMITIES: Without any cyanosis, clubbing, rash, lesions or edema. NEUROLOGIC: AOx3, no focal motor deficit. SKIN: no jaundice, no rashes  Assessment/Plan 72 y/o M with PMH HTN, coming for screening colonoscopy.  Will proceed with colonoscopy.  Dolores Frame, MD 08/15/2022, 10:26 AM

## 2022-08-15 NOTE — Anesthesia Procedure Notes (Signed)
Date/Time: 08/15/2022 10:29 AM  Performed by: Julian Reil, CRNAPre-anesthesia Checklist: Emergency Drugs available, Patient identified, Suction available and Patient being monitored Patient Re-evaluated:Patient Re-evaluated prior to induction Oxygen Delivery Method: Nasal cannula Induction Type: IV induction Placement Confirmation: positive ETCO2

## 2022-08-15 NOTE — Op Note (Signed)
[facility] Patient Name: Brandon Neal Procedure Date: 08/15/2022 10:19 AM MRN: [id number] Date of Birth: 12-20-1950 Attending MD: Katrinka Blazing , , [id number] CSN: [id number] Age: 72 Admit Type: Outpatient Procedure:                Colonoscopy Indications:              Screening for colorectal malignant neoplasm Providers:                Katrinka Blazing, Angelica Ran, Volanda Napoleon.,                            Technician Referring MD:              Medicines:                Monitored Anesthesia Care Complications:            No immediate complications. Estimated Blood Loss:     Estimated blood loss: none. Procedure:                Pre-Anesthesia Assessment:                           - Prior to the procedure, a History and Physical                            was performed, and patient medications, allergies                            and sensitivities were reviewed. The patient's                            tolerance of previous anesthesia was reviewed.                           - The risks and benefits of the procedure and the                            sedation options and risks were discussed with the                            patient. All questions were answered and informed                            consent was obtained.                           - ASA Grade Assessment: II - A patient with mild                            systemic disease.                           After obtaining informed consent, the colonoscope                            was passed under direct vision. Throughout the  procedure, the patient's blood pressure, pulse, and                            oxygen saturations were monitored continuously. The                            [id number]-HQ190L ([id number]) scope was introduced through                            the anus and advanced to the the cecum, identified                            by appendiceal orifice and ileocecal valve. The                             colonoscopy was performed without difficulty. The                            patient tolerated the procedure well. The quality                            of the bowel preparation was good. Scope In: 10:35:30 AM Scope Out: 10:55:52 AM Scope Withdrawal Time: 0 hours 16 minutes 12 seconds  Total Procedure Duration: 0 hours 20 minutes 22 seconds  Findings:      The perianal and digital rectal examinations were normal.      Three sessile polyps were found in the transverse colon and ascending       colon. The polyps were 4 to 8 mm in size. These polyps were removed with       a cold snare. Resection and retrieval were complete.      The retroflexed view of the distal rectum and anal verge was normal and       showed no anal or rectal abnormalities. Impression:               - Three 4 to 8 mm polyps in the transverse colon                            and in the ascending colon, removed with a cold                            snare. Resected and retrieved.                           - The distal rectum and anal verge are normal on                            retroflexion view. Moderate Sedation:      Per Anesthesia Care Recommendation:           - Discharge patient to home (ambulatory).                           - Resume previous diet.                           -  Await pathology results.                           - Repeat colonoscopy for surveillance based on                            pathology results. Procedure Code(s):        --- Professional ---                           [phone], Colonoscopy, flexible; with removal of                            tumor(s), polyp(s), or other lesion(s) by snare                            technique Diagnosis Code(s):        --- Professional ---                           Z12.11, Encounter for screening for malignant                            neoplasm of colon                           D12.3, Benign neoplasm of transverse colon  (hepatic                            flexure or splenic flexure)                           D12.2, Benign neoplasm of ascending colon                           K64.8, Other hemorrhoids CPT copyright 2022 American [employer]. All rights reserved. The codes documented in this report are preliminary and upon coder review may  be revised to meet current compliance requirements. Katrinka Blazing, MD Katrinka Blazing,  08/15/2022 11:02:10 AM This report has been signed electronically. Number of Addenda: 0

## 2022-08-15 NOTE — Anesthesia Preprocedure Evaluation (Signed)
Anesthesia Evaluation  Patient identified by MRN, date of birth, ID band Patient awake    Reviewed: Allergy & Precautions, H&P , NPO status , Patient's Chart, lab work & pertinent test results  Airway Mallampati: III  TM Distance: >3 FB Neck ROM: Full    Dental  (+) Dental Advisory Given, Partial Upper   Pulmonary Current Smoker and Patient abstained from smoking.   Pulmonary exam normal breath sounds clear to auscultation       Cardiovascular negative cardio ROS Normal cardiovascular exam Rhythm:Regular Rate:Normal     Neuro/Psych negative neurological ROS  negative psych ROS   GI/Hepatic negative GI ROS, Neg liver ROS,,,  Endo/Other  negative endocrine ROS    Renal/GU negative Renal ROS  negative genitourinary   Musculoskeletal negative musculoskeletal ROS (+)    Abdominal   Peds negative pediatric ROS (+)  Hematology negative hematology ROS (+)   Anesthesia Other Findings   Reproductive/Obstetrics negative OB ROS                             Anesthesia Physical Anesthesia Plan  ASA: 2  Anesthesia Plan: General   Post-op Pain Management: Minimal or no pain anticipated   Induction: Intravenous  PONV Risk Score and Plan: 1 and Propofol infusion  Airway Management Planned: Nasal Cannula and Natural Airway  Additional Equipment:   Intra-op Plan:   Post-operative Plan:   Informed Consent: I have reviewed the patients History and Physical, chart, labs and discussed the procedure including the risks, benefits and alternatives for the proposed anesthesia with the patient or authorized representative who has indicated his/her understanding and acceptance.     Dental advisory given  Plan Discussed with: CRNA and Surgeon  Anesthesia Plan Comments:         Anesthesia Quick Evaluation

## 2022-08-16 LAB — SURGICAL PATHOLOGY

## 2022-08-21 ENCOUNTER — Encounter (HOSPITAL_COMMUNITY): Payer: Self-pay | Admitting: Gastroenterology

## 2022-08-25 ENCOUNTER — Other Ambulatory Visit (HOSPITAL_COMMUNITY): Payer: Self-pay | Admitting: Internal Medicine

## 2022-08-25 DIAGNOSIS — F17210 Nicotine dependence, cigarettes, uncomplicated: Secondary | ICD-10-CM

## 2022-08-25 DIAGNOSIS — Z720 Tobacco use: Secondary | ICD-10-CM

## 2022-08-30 ENCOUNTER — Ambulatory Visit (HOSPITAL_COMMUNITY)
Admission: RE | Admit: 2022-08-30 | Discharge: 2022-08-30 | Disposition: A | Discharge status: Home / Self Care | Payer: PPO | Source: Ambulatory Visit | Attending: Internal Medicine | Admitting: Internal Medicine

## 2022-08-30 DIAGNOSIS — F17210 Nicotine dependence, cigarettes, uncomplicated: Secondary | ICD-10-CM

## 2022-08-30 DIAGNOSIS — Z720 Tobacco use: Secondary | ICD-10-CM | POA: Insufficient documentation

## 2023-01-29 DIAGNOSIS — E118 Type 2 diabetes mellitus with unspecified complications: Secondary | ICD-10-CM | POA: Diagnosis not present

## 2023-02-05 DIAGNOSIS — E1169 Type 2 diabetes mellitus with other specified complication: Secondary | ICD-10-CM | POA: Diagnosis not present

## 2023-07-31 DIAGNOSIS — Z125 Encounter for screening for malignant neoplasm of prostate: Secondary | ICD-10-CM | POA: Diagnosis not present

## 2023-07-31 DIAGNOSIS — E785 Hyperlipidemia, unspecified: Secondary | ICD-10-CM | POA: Diagnosis not present

## 2023-07-31 DIAGNOSIS — E118 Type 2 diabetes mellitus with unspecified complications: Secondary | ICD-10-CM | POA: Diagnosis not present

## 2023-07-31 DIAGNOSIS — I10 Essential (primary) hypertension: Secondary | ICD-10-CM | POA: Diagnosis not present

## 2023-07-31 DIAGNOSIS — I700 Atherosclerosis of aorta: Secondary | ICD-10-CM | POA: Diagnosis not present

## 2023-07-31 DIAGNOSIS — Z79899 Other long term (current) drug therapy: Secondary | ICD-10-CM | POA: Diagnosis not present

## 2023-08-07 DIAGNOSIS — Z0001 Encounter for general adult medical examination with abnormal findings: Secondary | ICD-10-CM | POA: Diagnosis not present

## 2023-08-07 DIAGNOSIS — J439 Emphysema, unspecified: Secondary | ICD-10-CM | POA: Diagnosis not present

## 2023-08-07 DIAGNOSIS — E785 Hyperlipidemia, unspecified: Secondary | ICD-10-CM | POA: Diagnosis not present

## 2023-08-07 DIAGNOSIS — E1169 Type 2 diabetes mellitus with other specified complication: Secondary | ICD-10-CM | POA: Diagnosis not present

## 2023-08-07 DIAGNOSIS — I700 Atherosclerosis of aorta: Secondary | ICD-10-CM | POA: Diagnosis not present

## 2023-08-07 DIAGNOSIS — I10 Essential (primary) hypertension: Secondary | ICD-10-CM | POA: Diagnosis not present

## 2023-11-05 ENCOUNTER — Other Ambulatory Visit (HOSPITAL_COMMUNITY): Payer: Self-pay | Admitting: Internal Medicine

## 2023-11-05 DIAGNOSIS — F17210 Nicotine dependence, cigarettes, uncomplicated: Secondary | ICD-10-CM

## 2023-11-05 DIAGNOSIS — Z720 Tobacco use: Secondary | ICD-10-CM

## 2023-11-17 ENCOUNTER — Ambulatory Visit (HOSPITAL_COMMUNITY)
Admission: RE | Admit: 2023-11-17 | Discharge: 2023-11-17 | Disposition: A | Discharge status: Home / Self Care | Source: Ambulatory Visit | Attending: Internal Medicine | Admitting: Internal Medicine

## 2023-11-17 DIAGNOSIS — Z720 Tobacco use: Secondary | ICD-10-CM | POA: Insufficient documentation

## 2023-11-17 DIAGNOSIS — F17210 Nicotine dependence, cigarettes, uncomplicated: Secondary | ICD-10-CM | POA: Diagnosis not present

## 2024-02-06 DIAGNOSIS — Z79899 Other long term (current) drug therapy: Secondary | ICD-10-CM | POA: Diagnosis not present

## 2024-02-06 DIAGNOSIS — E118 Type 2 diabetes mellitus with unspecified complications: Secondary | ICD-10-CM | POA: Diagnosis not present

## 2024-02-13 DIAGNOSIS — E1169 Type 2 diabetes mellitus with other specified complication: Secondary | ICD-10-CM | POA: Diagnosis not present

## 2024-03-05 ENCOUNTER — Encounter (INDEPENDENT_AMBULATORY_CARE_PROVIDER_SITE_OTHER): Payer: Self-pay | Admitting: Gastroenterology
